# Patient Record
Sex: Male | Born: 1945 | Race: White | Hispanic: No | Marital: Single | Smoking: Former smoker
Health system: Southern US, Community
[De-identification: ages and names within clinical notes are randomized; demographics above are authoritative.]

## PROBLEM LIST (undated history)

## (undated) DIAGNOSIS — I10 Essential (primary) hypertension: Secondary | ICD-10-CM

## (undated) DIAGNOSIS — M1990 Unspecified osteoarthritis, unspecified site: Secondary | ICD-10-CM

## (undated) DIAGNOSIS — H04129 Dry eye syndrome of unspecified lacrimal gland: Secondary | ICD-10-CM

## (undated) DIAGNOSIS — N529 Male erectile dysfunction, unspecified: Secondary | ICD-10-CM

## (undated) DIAGNOSIS — K759 Inflammatory liver disease, unspecified: Secondary | ICD-10-CM

## (undated) DIAGNOSIS — K219 Gastro-esophageal reflux disease without esophagitis: Secondary | ICD-10-CM

## (undated) DIAGNOSIS — L409 Psoriasis, unspecified: Secondary | ICD-10-CM

## (undated) DIAGNOSIS — K5790 Diverticulosis of intestine, part unspecified, without perforation or abscess without bleeding: Secondary | ICD-10-CM

## (undated) DIAGNOSIS — R3915 Urgency of urination: Secondary | ICD-10-CM

## (undated) DIAGNOSIS — R002 Palpitations: Secondary | ICD-10-CM

## (undated) DIAGNOSIS — L719 Rosacea, unspecified: Secondary | ICD-10-CM

## (undated) DIAGNOSIS — G4761 Periodic limb movement disorder: Secondary | ICD-10-CM

## (undated) DIAGNOSIS — F329 Major depressive disorder, single episode, unspecified: Secondary | ICD-10-CM

## (undated) DIAGNOSIS — F1020 Alcohol dependence, uncomplicated: Secondary | ICD-10-CM

## (undated) DIAGNOSIS — H3530 Unspecified macular degeneration: Secondary | ICD-10-CM

## (undated) DIAGNOSIS — E119 Type 2 diabetes mellitus without complications: Secondary | ICD-10-CM

## (undated) DIAGNOSIS — R351 Nocturia: Secondary | ICD-10-CM

## (undated) DIAGNOSIS — E785 Hyperlipidemia, unspecified: Secondary | ICD-10-CM

## (undated) DIAGNOSIS — T4145XA Adverse effect of unspecified anesthetic, initial encounter: Secondary | ICD-10-CM

## (undated) DIAGNOSIS — G5620 Lesion of ulnar nerve, unspecified upper limb: Secondary | ICD-10-CM

## (undated) DIAGNOSIS — R945 Abnormal results of liver function studies: Secondary | ICD-10-CM

## (undated) DIAGNOSIS — T8859XA Other complications of anesthesia, initial encounter: Secondary | ICD-10-CM

## (undated) DIAGNOSIS — R7989 Other specified abnormal findings of blood chemistry: Secondary | ICD-10-CM

## (undated) DIAGNOSIS — M5412 Radiculopathy, cervical region: Secondary | ICD-10-CM

## (undated) DIAGNOSIS — M542 Cervicalgia: Secondary | ICD-10-CM

## (undated) DIAGNOSIS — F32A Depression, unspecified: Secondary | ICD-10-CM

## (undated) DIAGNOSIS — C44301 Unspecified malignant neoplasm of skin of nose: Secondary | ICD-10-CM

## (undated) DIAGNOSIS — F419 Anxiety disorder, unspecified: Secondary | ICD-10-CM

## (undated) DIAGNOSIS — R3129 Other microscopic hematuria: Secondary | ICD-10-CM

## (undated) DIAGNOSIS — G4730 Sleep apnea, unspecified: Secondary | ICD-10-CM

## (undated) DIAGNOSIS — M4802 Spinal stenosis, cervical region: Secondary | ICD-10-CM

## (undated) DIAGNOSIS — M722 Plantar fascial fibromatosis: Secondary | ICD-10-CM

## (undated) HISTORY — DX: Depression, unspecified: F32.A

## (undated) HISTORY — DX: Spinal stenosis, cervical region: M48.02

## (undated) HISTORY — DX: Abnormal results of liver function studies: R94.5

## (undated) HISTORY — DX: Other specified abnormal findings of blood chemistry: R79.89

## (undated) HISTORY — DX: Other microscopic hematuria: R31.29

## (undated) HISTORY — DX: Radiculopathy, cervical region: M54.12

## (undated) HISTORY — DX: Male erectile dysfunction, unspecified: N52.9

## (undated) HISTORY — DX: Psoriasis, unspecified: L40.9

## (undated) HISTORY — DX: Urgency of urination: R39.15

## (undated) HISTORY — DX: Plantar fascial fibromatosis: M72.2

## (undated) HISTORY — DX: Dry eye syndrome of unspecified lacrimal gland: H04.129

## (undated) HISTORY — DX: Alcohol dependence, uncomplicated: F10.20

## (undated) HISTORY — PX: UMBILICAL HERNIA REPAIR: SHX2598

## (undated) HISTORY — DX: Lesion of ulnar nerve, unspecified upper limb: G56.20

## (undated) HISTORY — PX: CERVICAL SPINE SURGERY: SHX589

## (undated) HISTORY — DX: Palpitations: R00.2

## (undated) HISTORY — DX: Diverticulosis of intestine, part unspecified, without perforation or abscess without bleeding: K57.90

## (undated) HISTORY — DX: Hyperlipidemia, unspecified: E78.5

## (undated) HISTORY — DX: Nocturia: R35.1

## (undated) HISTORY — DX: Gastro-esophageal reflux disease without esophagitis: K21.9

## (undated) HISTORY — DX: Cervicalgia: M54.2

## (undated) HISTORY — DX: Unspecified macular degeneration: H35.30

## (undated) HISTORY — DX: Periodic limb movement disorder: G47.61

## (undated) HISTORY — DX: Type 2 diabetes mellitus without complications: E11.9

## (undated) HISTORY — DX: Unspecified malignant neoplasm of skin of nose: C44.301

## (undated) HISTORY — DX: Major depressive disorder, single episode, unspecified: F32.9

## (undated) HISTORY — DX: Rosacea, unspecified: L71.9

## (undated) HISTORY — DX: Unspecified osteoarthritis, unspecified site: M19.90

---

## 2003-09-15 DIAGNOSIS — G5620 Lesion of ulnar nerve, unspecified upper limb: Secondary | ICD-10-CM

## 2003-09-15 HISTORY — DX: Lesion of ulnar nerve, unspecified upper limb: G56.20

## 2005-01-23 ENCOUNTER — Encounter: Admission: RE | Admit: 2005-01-23 | Discharge: 2005-01-23 | Payer: Self-pay | Admitting: Urology

## 2005-01-25 ENCOUNTER — Ambulatory Visit (HOSPITAL_BASED_OUTPATIENT_CLINIC_OR_DEPARTMENT_OTHER): Admission: RE | Admit: 2005-01-25 | Discharge: 2005-01-25 | Payer: Self-pay | Admitting: Urology

## 2005-01-25 ENCOUNTER — Ambulatory Visit (HOSPITAL_COMMUNITY): Admission: RE | Admit: 2005-01-25 | Discharge: 2005-01-25 | Payer: Self-pay | Admitting: Urology

## 2007-11-11 ENCOUNTER — Encounter: Admission: RE | Admit: 2007-11-11 | Discharge: 2007-11-11 | Payer: Self-pay | Admitting: Otolaryngology

## 2008-03-12 ENCOUNTER — Encounter: Admission: RE | Admit: 2008-03-12 | Discharge: 2008-03-12 | Payer: Self-pay | Admitting: Family Medicine

## 2008-06-24 ENCOUNTER — Encounter: Admission: RE | Admit: 2008-06-24 | Discharge: 2008-06-24 | Payer: Self-pay | Admitting: Family Medicine

## 2008-07-27 ENCOUNTER — Emergency Department (HOSPITAL_COMMUNITY): Admission: EM | Admit: 2008-07-27 | Discharge: 2008-07-27 | Payer: Self-pay | Admitting: Emergency Medicine

## 2008-09-04 ENCOUNTER — Encounter: Admission: RE | Admit: 2008-09-04 | Discharge: 2008-09-04 | Payer: Self-pay | Admitting: Psychiatry

## 2008-09-28 ENCOUNTER — Other Ambulatory Visit (HOSPITAL_COMMUNITY): Admission: RE | Admit: 2008-09-28 | Discharge: 2008-12-08 | Payer: Self-pay | Admitting: Psychiatry

## 2008-10-08 ENCOUNTER — Ambulatory Visit: Payer: Self-pay | Admitting: Psychiatry

## 2008-10-13 ENCOUNTER — Encounter: Admission: RE | Admit: 2008-10-13 | Discharge: 2008-12-29 | Payer: Self-pay | Admitting: Family Medicine

## 2008-12-07 ENCOUNTER — Encounter: Admission: RE | Admit: 2008-12-07 | Discharge: 2008-12-07 | Payer: Self-pay | Admitting: Neurological Surgery

## 2009-07-23 ENCOUNTER — Ambulatory Visit (HOSPITAL_COMMUNITY): Admission: RE | Admit: 2009-07-23 | Discharge: 2009-07-24 | Payer: Self-pay | Admitting: Neurological Surgery

## 2009-08-17 ENCOUNTER — Encounter: Admission: RE | Admit: 2009-08-17 | Discharge: 2009-08-17 | Payer: Self-pay | Admitting: Neurological Surgery

## 2009-10-11 ENCOUNTER — Encounter: Admission: RE | Admit: 2009-10-11 | Discharge: 2009-10-11 | Payer: Self-pay | Admitting: Neurological Surgery

## 2010-08-22 ENCOUNTER — Encounter
Admission: RE | Admit: 2010-08-22 | Discharge: 2010-08-22 | Payer: Self-pay | Source: Home / Self Care | Attending: Neurological Surgery | Admitting: Neurological Surgery

## 2010-09-04 ENCOUNTER — Encounter: Payer: Self-pay | Admitting: Otolaryngology

## 2010-09-12 ENCOUNTER — Encounter
Admission: RE | Admit: 2010-09-12 | Discharge: 2010-09-12 | Payer: Self-pay | Source: Home / Self Care | Attending: Neurological Surgery | Admitting: Neurological Surgery

## 2010-11-15 LAB — BASIC METABOLIC PANEL
CO2: 27 mEq/L (ref 19–32)
Chloride: 104 mEq/L (ref 96–112)
Creatinine, Ser: 0.7 mg/dL (ref 0.4–1.5)
GFR calc Af Amer: >60 mL/min (ref >60)
Potassium: 3.5 mEq/L (ref 3.5–5.1)
Sodium: 138 mEq/L (ref 135–145)

## 2010-11-15 LAB — DIFFERENTIAL
Eosinophils Relative: 3 % (ref 0–5)
Lymphocytes Relative: 36 % (ref 12–46)
Monocytes Absolute: 0.6 10*3/uL (ref 0.1–1.0)
Neutro Abs: 4 10*3/uL (ref 1.7–7.7)

## 2010-11-15 LAB — CBC
MCV: 94.4 fL (ref 78.0–100.0)
WBC: 7.6 10*3/uL (ref 4.0–10.5)

## 2010-11-15 LAB — APTT: aPTT: 32 seconds (ref 24–37)

## 2010-11-15 LAB — PROTIME-INR: Prothrombin Time: 12.7 seconds (ref 11.6–15.2)

## 2010-11-23 LAB — URINE DRUGS OF ABUSE SCREEN W ALC, ROUTINE (REF LAB)
Barbiturate Quant, Ur: NEGATIVE
Benzodiazepines.: NEGATIVE
Creatinine,U: 127.2 mg/dL
Phencyclidine (PCP): NEGATIVE
Propoxyphene: NEGATIVE

## 2010-11-24 LAB — URINE DRUGS OF ABUSE SCREEN W ALC, ROUTINE (REF LAB)
Amphetamine Screen, Ur: NEGATIVE
Amphetamine Screen, Ur: NEGATIVE
Benzodiazepines.: NEGATIVE
Cocaine Metabolites: NEGATIVE
Creatinine,U: 178.6 mg/dL
Creatinine,U: 147.2 mg/dL
Ethyl Alcohol: <10 mg/dL (ref <10)
Marijuana Metabolite: NEGATIVE
Methadone: NEGATIVE
Methadone: NEGATIVE
Phencyclidine (PCP): NEGATIVE
Phencyclidine (PCP): NEGATIVE
Propoxyphene: NEGATIVE

## 2010-12-30 NOTE — Op Note (Signed)
NAME:  Shannon Stone, Shannon Stone NO.:  [id number]   MEDICAL RECORD NO.:  [id number]          PATIENT TYPE:  AMB   LOCATION:  [facility]                         FACILITY:  [facility]   PHYSICIAN:  Rozanna Boer., M.D.DATE OF BIRTH:  August 23, 1945   DATE OF PROCEDURE:  01/25/2005  DATE OF DISCHARGE:                                 OPERATIVE REPORT   PREOPERATIVE DIAGNOSIS:  Phimosis.   POSTOPERATIVE DIAGNOSIS:  Phimosis.   OPERATION:  Circumcision.   ANESTHESIA:  General.   SURGEON:  Courtney Paris, M.D.   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  His wife has since left him and he is not sexually active at  the moment, but has a phimosis and comes in for elective circumcision at  this time.  He says he is borderline diabetic.   The patient was placed supine on the operating table after satisfactory  induction of general anesthesia.  He was prepped and draped with Betadine in  the usual sterile fashion.  The redundant skin was marked off using a  marking pen, and a circumferential incision made.  The skin was then  retracted and another circumferential incision made around the coronal  sulcus.  The skin was then removed with the Bovie with a fine needle  electrode to effect good hemostasis.  The edges of the skin were then  reapproximated with interrupted 4-0 chromic catgut suture to effect a good  cosmetic result.  8 mL of 0.25% plain Marcaine was injected in the base of  the penis for postoperative pain relief, and he was taken to the recovery  room in good condition where he will later be discharged as an outpatient.  Will be sent home with detailed written instructions.       HMK/MEDQ  D:  01/25/2005  T:  01/25/2005  Job:  [id number]

## 2011-06-05 ENCOUNTER — Other Ambulatory Visit: Payer: Self-pay | Admitting: Orthopedic Surgery

## 2011-06-05 DIAGNOSIS — R609 Edema, unspecified: Secondary | ICD-10-CM

## 2011-06-05 DIAGNOSIS — R52 Pain, unspecified: Secondary | ICD-10-CM

## 2011-06-11 ENCOUNTER — Ambulatory Visit
Admission: RE | Admit: 2011-06-11 | Discharge: 2011-06-11 | Disposition: A | Discharge status: Home / Self Care | Payer: BC Managed Care – PPO | Source: Ambulatory Visit | Attending: Orthopedic Surgery | Admitting: Orthopedic Surgery

## 2011-06-11 DIAGNOSIS — R609 Edema, unspecified: Secondary | ICD-10-CM

## 2011-06-11 DIAGNOSIS — R52 Pain, unspecified: Secondary | ICD-10-CM

## 2011-08-15 HISTORY — PX: KNEE SURGERY: SHX244

## 2011-11-20 ENCOUNTER — Ambulatory Visit (HOSPITAL_COMMUNITY)
Admission: RE | Admit: 2011-11-20 | Discharge: 2011-11-20 | Disposition: A | Discharge status: Home / Self Care | Payer: BC Managed Care – PPO | Source: Ambulatory Visit | Attending: Neurological Surgery | Admitting: Neurological Surgery

## 2011-11-20 ENCOUNTER — Other Ambulatory Visit (HOSPITAL_COMMUNITY): Payer: Self-pay | Admitting: Neurological Surgery

## 2011-11-20 DIAGNOSIS — M5412 Radiculopathy, cervical region: Secondary | ICD-10-CM

## 2011-11-20 DIAGNOSIS — M542 Cervicalgia: Secondary | ICD-10-CM

## 2011-11-20 DIAGNOSIS — M47812 Spondylosis without myelopathy or radiculopathy, cervical region: Secondary | ICD-10-CM | POA: Insufficient documentation

## 2011-11-20 DIAGNOSIS — Z981 Arthrodesis status: Secondary | ICD-10-CM | POA: Insufficient documentation

## 2011-11-20 DIAGNOSIS — M79609 Pain in unspecified limb: Secondary | ICD-10-CM | POA: Insufficient documentation

## 2011-11-21 ENCOUNTER — Encounter (HOSPITAL_COMMUNITY): Payer: Self-pay | Admitting: Pharmacy Technician

## 2011-11-25 NOTE — Pre-Procedure Instructions (Signed)
20 Maison Kestenbaum  11/25/2011   Your procedure is scheduled on:  Fri, April 19 @ 1200 PM  Report to [facility] at 1000 AM.  Call this number if you have problems the morning of surgery: [phone]   Remember:   Do not eat food:After Midnight.  May have clear liquids: up to 4 Hours before arrival.(until 6:00 am)  Clear liquids include soda, tea, black coffee, apple or grape juice, broth,water  Take these medicines the morning of surgery with A SIP OF WATER: Amlodipine,Atenolol,and Paxil  Do not wear jewelry  Do not wear lotions, powders, or perfumes.   Do not bring valuables to the hospital.  Contacts, dentures or bridgework may not be worn into surgery.  Leave suitcase in the car. After surgery it may be brought to your room.  For patients admitted to the hospital, checkout time is 11:00 AM the day of discharge.   Special Instructions: CHG Shower Use Special Wash: 1/2 bottle night before surgery and 1/2 bottle morning of surgery.   Please read over the following fact sheets that you were given: Pain Booklet, Coughing and Deep Breathing, MRSA Information and Surgical Site Infection Prevention

## 2011-11-27 ENCOUNTER — Encounter (HOSPITAL_COMMUNITY)
Admission: RE | Admit: 2011-11-27 | Discharge: 2011-11-27 | Disposition: A | Discharge status: Home / Self Care | Payer: BC Managed Care – PPO | Source: Ambulatory Visit | Attending: Neurological Surgery | Admitting: Neurological Surgery

## 2011-11-27 ENCOUNTER — Encounter (HOSPITAL_COMMUNITY): Payer: Self-pay

## 2011-11-27 ENCOUNTER — Encounter (HOSPITAL_COMMUNITY)
Admission: RE | Admit: 2011-11-27 | Discharge: 2011-11-27 | Disposition: A | Discharge status: Home / Self Care | Payer: BC Managed Care – PPO | Source: Ambulatory Visit | Attending: Anesthesiology | Admitting: Anesthesiology

## 2011-11-27 HISTORY — DX: Adverse effect of unspecified anesthetic, initial encounter: T41.45XA

## 2011-11-27 HISTORY — DX: Other complications of anesthesia, initial encounter: T88.59XA

## 2011-11-27 HISTORY — DX: Sleep apnea, unspecified: G47.30

## 2011-11-27 HISTORY — DX: Essential (primary) hypertension: I10

## 2011-11-27 HISTORY — DX: Inflammatory liver disease, unspecified: K75.9

## 2011-11-27 HISTORY — DX: Anxiety disorder, unspecified: F41.9

## 2011-11-27 LAB — CBC
Hemoglobin: 14.9 g/dL (ref 13.0–17.0)
MCH: 31.3 pg (ref 26.0–34.0)
MCHC: 36.3 g/dL — ABNORMAL HIGH (ref 30.0–36.0)
RDW: 13.7 % (ref 11.5–15.5)

## 2011-11-27 LAB — COMPREHENSIVE METABOLIC PANEL
Albumin: 3.5 g/dL (ref 3.5–5.2)
BUN: 10 mg/dL (ref 6–23)
CO2: 29 mEq/L (ref 19–32)
Calcium: 9 mg/dL (ref 8.4–10.5)
Chloride: 98 mEq/L (ref 96–112)
Creatinine, Ser: 0.56 mg/dL (ref 0.50–1.35)
GFR calc non Af Amer: >90 mL/min (ref >90)
Total Bilirubin: 0.4 mg/dL (ref 0.3–1.2)

## 2011-11-27 LAB — APTT: aPTT: 30 seconds (ref 24–37)

## 2011-11-27 LAB — PROTIME-INR
INR: 0.93 (ref 0.00–1.49)
Prothrombin Time: 12.7 seconds (ref 11.6–15.2)

## 2011-11-27 LAB — SURGICAL PCR SCREEN: MRSA, PCR: NEGATIVE

## 2011-11-27 NOTE — Progress Notes (Signed)
REREQUESTED ORDERS FROM VANESSA AT DR [facility] OFFICE.

## 2011-11-27 NOTE — Progress Notes (Signed)
LEFT VOICEMAIL FOR DR FULPS NURSE TO SEE IF THEY HAVE COPY OF STRESS TEST OR KNOW WHERE PATIENT HAD DONE.   [phone]

## 2011-11-27 NOTE — Progress Notes (Signed)
REQUESTED STRESS TEST, OV CARDIAC STUDIES AND SLEEP STUDY FROM [facility] .

## 2011-11-27 NOTE — Pre-Procedure Instructions (Signed)
20 Shannon Stone  11/27/2011   Your procedure is scheduled on:   Friday 12/01/11  Report to [facility] at 1000 AM.  Call this number if you have problems the morning of surgery: [phone]   Remember:   Do not eat food:After Midnight.  May have clear liquids: up to 4 Hours before arrival.  Clear liquids include soda, tea, black coffee, apple or grape juice, broth.  Take these medicines the morning of surgery with A SIP OF WATER:  AMLODIPINE (NORVASC), ATENOLOL(TENORMIN), PAXIL   Do not wear jewelry, make-up or nail polish.  Do not wear lotions, powders, or perfumes. You may wear deodorant.  Do not shave 48 hours prior to surgery.  Do not bring valuables to the hospital.  Contacts, dentures or bridgework may not be worn into surgery.  Leave suitcase in the car. After surgery it may be brought to your room.  For patients admitted to the hospital, checkout time is 11:00 AM the day of discharge.   Patients discharged the day of surgery will not be allowed to drive home.  Name and phone number of your driver:   Special Instructions: CHG Shower Use Special Wash: 1/2 bottle night before surgery and 1/2 bottle morning of surgery.   Please read over the following fact sheets that you were given: Pain Booklet, MRSA Information and Surgical Site Infection Prevention

## 2011-11-29 NOTE — Consult Note (Addendum)
Anesthesia:  Patient is a 66 year old male posted for a C5-6 posterior cervical fusion on 12/01/11.  History includes HTN, OSA, obesity, anxiety, post-operative urinary retention, skin cancer, Hepatitis C (? Cleared by history).  Prior c-spine (2010), knee, and UHR surgeries.  Labs noted.  Glucose is elevated at 353.  He did not report any history of DM.  LFTS, PLTs, coags WNL.  CXR on 11/27/11 showed chronic bronchitic changes with slightly increased atelectasis versus scarring at left base.   EKG on 11/27/11 showed SB @ 56 bpm.  He had a prior stress test which his PAT nurse is trying to locate.  His PCP is Dr. Jillyn Hidden [phone]).  I have left a message for Mr. Coger to contact me so I can discuss urgent follow-up with his PCP for evaluation of his hyperglycemia pre-operatively.  I have updated Erie Noe at Dr. Yetta Barre' office. She will also attempt to contact him.  Addendum: 11/30/11 1330  Mr.Treadwell called me earlier this morning.  He denied history of DM, but had recently been told that his glucose was elevated by Dr. Jillyn Hidden.  He has not had any recent steroids that would effect his glucose.  I just received a call back from Dr.Fulp.  She reports that his glucose (fasting, she believes) was 150 on 11/20/11.  He was asked to come back in for a A1C which has not been done yet.  Due to the fact that his surgery is scheduled for tomorrow, she would recommend that either he be admitted today for pre-operative glucose control/management or surgery be postponed until he is started on a hyperglycemic regimen.  If his surgery is postponed, Dr. Jillyn Hidden would like him seen in their office by 12/04/11.  Anesthesiologist Dr. Chaney Malling felt this was a reasonable plan.  I updated Erie Noe at Dr. Edward Qualia office.  I've asked Erie Noe to notify patient to follow-up at Dr. Debroah Baller office by 12/04/11 in his procedure is cancelled.  In regards to Mr. Warsame's treadmill stress test.  He thought it was a few years ago.  He could not tell me who  ordered it, where it was done, the exact date, or even why it was ordered.  He thinks it was routine.  He says it was "okay." He denies CP.  He was previously seen by Dr. Joselyn Arrow at the [facility] location, and their last stress test on file was dated from 2002.  [facility] does not have a stress test either.  Dr. Debroah Baller office did not have any old EKGs, but was asked to fax a copy of a recent stress test if they have one available.  Patient has only been seen there for about a year though.

## 2011-11-30 MED ORDER — CEFAZOLIN SODIUM-DEXTROSE 2-3 GM-% IV SOLR
2.0000 g | INTRAVENOUS | Status: AC
  Administered 2011-12-01: 2 g via INTRAVENOUS
  Filled 2011-11-30: qty 50

## 2011-11-30 MED ORDER — DEXAMETHASONE SODIUM PHOSPHATE 10 MG/ML IJ SOLN
10.0000 mg | Freq: Once | INTRAMUSCULAR | Status: DC
  Filled 2011-11-30: qty 1

## 2011-11-30 NOTE — Progress Notes (Signed)
[facility] office [phone] states there is only a stress test from 2002 in their records.   Left message for Dr. Yetta Barre nurse or Erie Noe about CBC done at PAT but CBC with Diff on paper orders that came after PAT done.  Do we need to do Diff DOS or is it okay to omit??

## 2011-12-01 ENCOUNTER — Ambulatory Visit (HOSPITAL_COMMUNITY)
Admission: RE | Admit: 2011-12-01 | Discharge: 2011-12-02 | Disposition: A | Discharge status: Home / Self Care | Payer: BC Managed Care – PPO | Source: Ambulatory Visit | Attending: Neurological Surgery | Admitting: Neurological Surgery

## 2011-12-01 ENCOUNTER — Encounter (HOSPITAL_COMMUNITY)
Admission: RE | Disposition: A | Discharge status: Home / Self Care | Payer: Self-pay | Source: Ambulatory Visit | Attending: Neurological Surgery

## 2011-12-01 ENCOUNTER — Encounter (HOSPITAL_COMMUNITY): Payer: Self-pay | Admitting: Vascular Surgery

## 2011-12-01 ENCOUNTER — Ambulatory Visit (HOSPITAL_COMMUNITY): Payer: BC Managed Care – PPO | Admitting: Vascular Surgery

## 2011-12-01 ENCOUNTER — Encounter (HOSPITAL_COMMUNITY): Payer: Self-pay | Admitting: *Deleted

## 2011-12-01 ENCOUNTER — Ambulatory Visit (HOSPITAL_COMMUNITY): Payer: BC Managed Care – PPO

## 2011-12-01 DIAGNOSIS — T84498A Other mechanical complication of other internal orthopedic devices, implants and grafts, initial encounter: Secondary | ICD-10-CM | POA: Insufficient documentation

## 2011-12-01 DIAGNOSIS — I10 Essential (primary) hypertension: Secondary | ICD-10-CM | POA: Insufficient documentation

## 2011-12-01 DIAGNOSIS — Z01812 Encounter for preprocedural laboratory examination: Secondary | ICD-10-CM | POA: Insufficient documentation

## 2011-12-01 DIAGNOSIS — Z981 Arthrodesis status: Secondary | ICD-10-CM | POA: Insufficient documentation

## 2011-12-01 DIAGNOSIS — Y832 Surgical operation with anastomosis, bypass or graft as the cause of abnormal reaction of the patient, or of later complication, without mention of misadventure at the time of the procedure: Secondary | ICD-10-CM | POA: Insufficient documentation

## 2011-12-01 HISTORY — PX: POSTERIOR CERVICAL FUSION/FORAMINOTOMY: SHX5038

## 2011-12-01 LAB — GLUCOSE, CAPILLARY: Glucose-Capillary: 133 mg/dL — ABNORMAL HIGH (ref 70–99)

## 2011-12-01 SURGERY — POSTERIOR CERVICAL FUSION/FORAMINOTOMY LEVEL 1
Anesthesia: General | Site: Neck | Laterality: Bilateral | Wound class: Clean

## 2011-12-01 MED ORDER — BUPIVACAINE HCL (PF) 0.25 % IJ SOLN
INTRAMUSCULAR | Status: DC | PRN
  Administered 2011-12-01: 8 mL
  Administered 2011-12-01: 14 mL

## 2011-12-01 MED ORDER — AMLODIPINE BESYLATE 10 MG PO TABS
10.0000 mg | ORAL_TABLET | Freq: Every day | ORAL | Status: DC
  Administered 2011-12-01: 10 mg via ORAL
  Filled 2011-12-01 (×3): qty 1

## 2011-12-01 MED ORDER — SODIUM CHLORIDE 0.9 % IV SOLN
INTRAVENOUS | Status: AC
  Filled 2011-12-01: qty 500

## 2011-12-01 MED ORDER — POTASSIUM CHLORIDE IN NACL 20-0.9 MEQ/L-% IV SOLN
INTRAVENOUS | Status: DC
  Administered 2011-12-01: 22:00:00 via INTRAVENOUS
  Filled 2011-12-01 (×3): qty 1000

## 2011-12-01 MED ORDER — LIDOCAINE HCL (CARDIAC) 20 MG/ML IV SOLN
INTRAVENOUS | Status: DC | PRN
  Administered 2011-12-01: 60 mg via INTRAVENOUS

## 2011-12-01 MED ORDER — ZOLPIDEM TARTRATE 5 MG PO TABS: 10.0000 mg | ORAL_TABLET | Freq: Every evening | ORAL | Status: DC | PRN

## 2011-12-01 MED ORDER — SODIUM CHLORIDE 0.9 % IJ SOLN: 3.0000 mL | INTRAMUSCULAR | Status: DC | PRN

## 2011-12-01 MED ORDER — SODIUM CHLORIDE 0.9 % IR SOLN
Status: DC | PRN
  Administered 2011-12-01: 15:00:00

## 2011-12-01 MED ORDER — FENTANYL CITRATE 0.05 MG/ML IJ SOLN
INTRAMUSCULAR | Status: DC | PRN
  Administered 2011-12-01: 150 ug via INTRAVENOUS
  Administered 2011-12-01 (×2): 50 ug via INTRAVENOUS

## 2011-12-01 MED ORDER — DEXAMETHASONE SODIUM PHOSPHATE 4 MG/ML IJ SOLN
4.0000 mg | Freq: Four times a day (QID) | INTRAMUSCULAR | Status: DC
  Administered 2011-12-02: 4 mg via INTRAVENOUS
  Filled 2011-12-01 (×5): qty 1

## 2011-12-01 MED ORDER — MIDAZOLAM HCL 5 MG/5ML IJ SOLN
INTRAMUSCULAR | Status: DC | PRN
  Administered 2011-12-01: 2 mg via INTRAVENOUS

## 2011-12-01 MED ORDER — HYDROMORPHONE HCL PF 1 MG/ML IJ SOLN
0.2500 mg | INTRAMUSCULAR | Status: DC | PRN
  Administered 2011-12-01 (×2): 0.5 mg via INTRAVENOUS

## 2011-12-01 MED ORDER — DEXTROSE 5 % IV SOLN
500.0000 mg | Freq: Four times a day (QID) | INTRAVENOUS | Status: DC | PRN
  Filled 2011-12-01: qty 5

## 2011-12-01 MED ORDER — LACTATED RINGERS IV SOLN
INTRAVENOUS | Status: DC | PRN
  Administered 2011-12-01 (×2): via INTRAVENOUS

## 2011-12-01 MED ORDER — CEFAZOLIN SODIUM 1-5 GM-% IV SOLN
1.0000 g | Freq: Three times a day (TID) | INTRAVENOUS | Status: AC
  Administered 2011-12-01 – 2011-12-02 (×2): 1 g via INTRAVENOUS
  Filled 2011-12-01 (×2): qty 50

## 2011-12-01 MED ORDER — MORPHINE SULFATE 2 MG/ML IJ SOLN: 0.0500 mg/kg | INTRAMUSCULAR | Status: DC | PRN

## 2011-12-01 MED ORDER — DEXAMETHASONE 4 MG PO TABS
4.0000 mg | ORAL_TABLET | Freq: Four times a day (QID) | ORAL | Status: DC
  Administered 2011-12-01 (×2): 4 mg via ORAL
  Filled 2011-12-01 (×6): qty 1

## 2011-12-01 MED ORDER — HYDROMORPHONE HCL PF 1 MG/ML IJ SOLN: 0.5000 mg | INTRAMUSCULAR | Status: DC | PRN

## 2011-12-01 MED ORDER — ROCURONIUM BROMIDE 100 MG/10ML IV SOLN
INTRAVENOUS | Status: DC | PRN
  Administered 2011-12-01: 50 mg via INTRAVENOUS

## 2011-12-01 MED ORDER — METHOCARBAMOL 500 MG PO TABS
500.0000 mg | ORAL_TABLET | Freq: Four times a day (QID) | ORAL | Status: DC | PRN
  Administered 2011-12-01: 500 mg via ORAL
  Filled 2011-12-01: qty 1

## 2011-12-01 MED ORDER — OXYCODONE-ACETAMINOPHEN 5-325 MG PO TABS
1.0000 | ORAL_TABLET | ORAL | Status: DC | PRN
  Administered 2011-12-01 – 2011-12-02 (×2): 2 via ORAL
  Filled 2011-12-01 (×2): qty 2

## 2011-12-01 MED ORDER — SODIUM CHLORIDE 0.9 % IJ SOLN
3.0000 mL | Freq: Two times a day (BID) | INTRAMUSCULAR | Status: DC
  Administered 2011-12-01: 3 mL via INTRAVENOUS

## 2011-12-01 MED ORDER — SENNA 8.6 MG PO TABS
1.0000 | ORAL_TABLET | Freq: Two times a day (BID) | ORAL | Status: DC
  Administered 2011-12-01: 8.6 mg via ORAL
  Filled 2011-12-01 (×3): qty 1

## 2011-12-01 MED ORDER — ONDANSETRON HCL 4 MG/2ML IJ SOLN
INTRAMUSCULAR | Status: DC | PRN
  Administered 2011-12-01: 4 mg via INTRAVENOUS

## 2011-12-01 MED ORDER — ACETAMINOPHEN 650 MG RE SUPP: 650.0000 mg | RECTAL | Status: DC | PRN

## 2011-12-01 MED ORDER — CHLORTHALIDONE 25 MG PO TABS
25.0000 mg | ORAL_TABLET | Freq: Every day | ORAL | Status: DC
  Administered 2011-12-01: 25 mg via ORAL
  Filled 2011-12-01 (×3): qty 1

## 2011-12-01 MED ORDER — THROMBIN 5000 UNITS EX KIT
PACK | CUTANEOUS | Status: DC | PRN
  Administered 2011-12-01 (×2): 5000 [IU] via TOPICAL

## 2011-12-01 MED ORDER — NEOSTIGMINE METHYLSULFATE 1 MG/ML IJ SOLN
INTRAMUSCULAR | Status: DC | PRN
  Administered 2011-12-01: 5 mg via INTRAVENOUS

## 2011-12-01 MED ORDER — ACETAMINOPHEN 325 MG PO TABS: 650.0000 mg | ORAL_TABLET | ORAL | Status: DC | PRN

## 2011-12-01 MED ORDER — HYDROMORPHONE HCL PF 1 MG/ML IJ SOLN
INTRAMUSCULAR | Status: AC
  Filled 2011-12-01: qty 1

## 2011-12-01 MED ORDER — ONDANSETRON HCL 4 MG/2ML IJ SOLN: 4.0000 mg | INTRAMUSCULAR | Status: DC | PRN

## 2011-12-01 MED ORDER — MENTHOL 3 MG MT LOZG: 1.0000 | LOZENGE | OROMUCOSAL | Status: DC | PRN

## 2011-12-01 MED ORDER — PAROXETINE HCL 20 MG PO TABS
40.0000 mg | ORAL_TABLET | Freq: Every day | ORAL | Status: DC
  Administered 2011-12-01: 40 mg via ORAL
  Filled 2011-12-01 (×3): qty 2

## 2011-12-01 MED ORDER — PROPOFOL 10 MG/ML IV EMUL
INTRAVENOUS | Status: DC | PRN
  Administered 2011-12-01: 200 mg via INTRAVENOUS

## 2011-12-01 MED ORDER — SODIUM CHLORIDE 0.9 % IV SOLN: 250.0000 mL | INTRAVENOUS | Status: DC

## 2011-12-01 MED ORDER — ATENOLOL 50 MG PO TABS
50.0000 mg | ORAL_TABLET | Freq: Every day | ORAL | Status: DC
  Administered 2011-12-01: 50 mg via ORAL
  Filled 2011-12-01 (×3): qty 1

## 2011-12-01 MED ORDER — PHENOL 1.4 % MT LIQD: 1.0000 | OROMUCOSAL | Status: DC | PRN

## 2011-12-01 MED ORDER — BACITRACIN 50000 UNITS IM SOLR
INTRAMUSCULAR | Status: AC
  Filled 2011-12-01: qty 1

## 2011-12-01 MED ORDER — GLYCOPYRROLATE 0.2 MG/ML IJ SOLN
INTRAMUSCULAR | Status: DC | PRN
  Administered 2011-12-01: 0.6 mg via INTRAVENOUS

## 2011-12-01 MED ORDER — 0.9 % SODIUM CHLORIDE (POUR BTL) OPTIME
TOPICAL | Status: DC | PRN
  Administered 2011-12-01: 1000 mL

## 2011-12-01 MED ORDER — ONDANSETRON HCL 4 MG/2ML IJ SOLN: 4.0000 mg | Freq: Once | INTRAMUSCULAR | Status: DC | PRN

## 2011-12-01 MED ORDER — HEMOSTATIC AGENTS (NO CHARGE) OPTIME
TOPICAL | Status: DC | PRN
  Administered 2011-12-01: 1 via TOPICAL

## 2011-12-01 MED ORDER — PAROXETINE HCL 20 MG PO TABS: 40.0000 mg | ORAL_TABLET | ORAL | Status: DC

## 2011-12-01 SURGICAL SUPPLY — 64 items
12 MM FIXED DRILL ×1 IMPLANT
APL SKNCLS STERI-STRIP NONHPOA (GAUZE/BANDAGES/DRESSINGS) ×2
BAG DECANTER FOR FLEXI CONT (MISCELLANEOUS) ×2 IMPLANT
BENZOIN TINCTURE PRP APPL 2/3 (GAUZE/BANDAGES/DRESSINGS) ×3 IMPLANT
BIT DRILL MOUNTAINER FXED 12MM (DRILL) IMPLANT
BLADE SURG ROTATE 9660 (MISCELLANEOUS) ×1 IMPLANT
BUR MATCHSTICK NEURO 3.0 LAGG (BURR) IMPLANT
CANISTER SUCTION 2500CC (MISCELLANEOUS) ×2 IMPLANT
CHLORAPREP W/TINT 26ML (MISCELLANEOUS) ×1 IMPLANT
CLOTH BEACON ORANGE TIMEOUT ST (SAFETY) ×2 IMPLANT
CONT SPEC 4OZ CLIKSEAL STRL BL (MISCELLANEOUS) ×2 IMPLANT
DRAPE C-ARM 42X72 X-RAY (DRAPES) ×4 IMPLANT
DRAPE INCISE 23X17 IOBAN STRL (DRAPES) ×1
DRAPE INCISE 23X17 STRL (DRAPES) IMPLANT
DRAPE INCISE IOBAN 23X17 STRL (DRAPES) ×1 IMPLANT
DRAPE LAPAROTOMY 100X72 PEDS (DRAPES) ×2 IMPLANT
DRAPE POUCH INSTRU U-SHP 10X18 (DRAPES) ×2 IMPLANT
DRESSING TELFA 8X3 (GAUZE/BANDAGES/DRESSINGS) ×2 IMPLANT
DRILL MOUNTAINEER FIXED 12MM (DRILL) ×2
DRSG OPSITE 4X5.5 SM (GAUZE/BANDAGES/DRESSINGS) ×3 IMPLANT
DURAPREP 26ML APPLICATOR (WOUND CARE) ×2 IMPLANT
ELECT REM PT RETURN 9FT ADLT (ELECTROSURGICAL) ×2
ELECTRODE REM PT RTRN 9FT ADLT (ELECTROSURGICAL) ×1 IMPLANT
EVACUATOR 1/8 PVC DRAIN (DRAIN) IMPLANT
GAUZE SPONGE 4X4 16PLY XRAY LF (GAUZE/BANDAGES/DRESSINGS) IMPLANT
GLOVE BIO SURGEON STRL SZ 6.5 (GLOVE) ×2 IMPLANT
GLOVE BIO SURGEON STRL SZ8 (GLOVE) ×2 IMPLANT
GLOVE BIOGEL PI IND STRL 6.5 (GLOVE) IMPLANT
GLOVE BIOGEL PI IND STRL 8.5 (GLOVE) IMPLANT
GLOVE BIOGEL PI INDICATOR 6.5 (GLOVE) ×1
GLOVE BIOGEL PI INDICATOR 8.5 (GLOVE) ×2
GLOVE ECLIPSE 7.5 STRL STRAW (GLOVE) ×1 IMPLANT
GLOVE SURG SS PI 8.0 STRL IVOR (GLOVE) ×2 IMPLANT
GOWN BRE IMP SLV AUR LG STRL (GOWN DISPOSABLE) ×2 IMPLANT
GOWN BRE IMP SLV AUR XL STRL (GOWN DISPOSABLE) ×2 IMPLANT
GOWN STRL REIN 2XL LVL4 (GOWN DISPOSABLE) ×2 IMPLANT
HEMOSTAT POWDER KIT SURGIFOAM (HEMOSTASIS) IMPLANT
KIT BASIN OR (CUSTOM PROCEDURE TRAY) ×2 IMPLANT
KIT ROOM TURNOVER OR (KITS) ×2 IMPLANT
NDL HYPO 25X1 1.5 SAFETY (NEEDLE) ×1 IMPLANT
NDL SPNL 20GX3.5 QUINCKE YW (NEEDLE) IMPLANT
NEEDLE HYPO 25X1 1.5 SAFETY (NEEDLE) ×2 IMPLANT
NEEDLE SPNL 20GX3.5 QUINCKE YW (NEEDLE) IMPLANT
NS IRRIG 1000ML POUR BTL (IV SOLUTION) ×2 IMPLANT
PACK LAMINECTOMY NEURO (CUSTOM PROCEDURE TRAY) ×2 IMPLANT
PAD ARMBOARD 7.5X6 YLW CONV (MISCELLANEOUS) ×4 IMPLANT
PIN MAYFIELD SKULL DISP (PIN) ×2 IMPLANT
PUTTY BONE DBX 5CC MIX (Putty) ×1 IMPLANT
ROD MOUNTAINEER 3.5X60 (Rod) ×1 IMPLANT
SCREW F A 3.5X14 (Screw) ×4 IMPLANT
SCREW INNER (Screw) ×4 IMPLANT
SPONGE GAUZE 4X4 12PLY (GAUZE/BANDAGES/DRESSINGS) ×2 IMPLANT
SPONGE LAP 4X18 X RAY DECT (DISPOSABLE) IMPLANT
SPONGE SURGIFOAM ABS GEL SZ50 (HEMOSTASIS) ×2 IMPLANT
STRIP CLOSURE SKIN 1/2X4 (GAUZE/BANDAGES/DRESSINGS) ×2 IMPLANT
SUT VIC AB 0 CT1 18XCR BRD8 (SUTURE) ×1 IMPLANT
SUT VIC AB 0 CT1 8-18 (SUTURE) ×2
SUT VIC AB 2-0 CP2 18 (SUTURE) ×2 IMPLANT
SUT VIC AB 3-0 SH 8-18 (SUTURE) ×2 IMPLANT
SWABSTICK BENZOIN STERILE (MISCELLANEOUS) ×2 IMPLANT
SYR 20ML ECCENTRIC (SYRINGE) ×2 IMPLANT
TOWEL OR 17X24 6PK STRL BLUE (TOWEL DISPOSABLE) ×2 IMPLANT
TOWEL OR 17X26 10 PK STRL BLUE (TOWEL DISPOSABLE) ×2 IMPLANT
WATER STERILE IRR 1000ML POUR (IV SOLUTION) ×2 IMPLANT

## 2011-12-01 NOTE — Progress Notes (Signed)
Needing to sign consent day of surgery as we did not have orders at PAT.

## 2011-12-01 NOTE — Transfer of Care (Signed)
Immediate Anesthesia Transfer of Care Note  Patient: Shannon Stone  Procedure(s) Performed: Procedure(s) (LRB): POSTERIOR CERVICAL FUSION/FORAMINOTOMY LEVEL 1 (Bilateral)  Patient Location: PACU  Anesthesia Type: General  Level of Consciousness: awake  Airway & Oxygen Therapy: Patient Spontanous Breathing  Post-op Assessment: Report given to PACU RN  Post vital signs: stable  Complications: No apparent anesthesia complications

## 2011-12-01 NOTE — Anesthesia Procedure Notes (Signed)
Procedure Name: Intubation Date/Time: 12/01/2011 2:46 PM Performed by: Hermelinda Dellen A Pre-anesthesia Checklist: Patient identified, Emergency Drugs available, Suction available, Patient being monitored and Timeout performed Patient Re-evaluated:Patient Re-evaluated prior to inductionOxygen Delivery Method: Circle system utilized Preoxygenation: Pre-oxygenation with 100% oxygen Intubation Type: IV induction Ventilation: Mask ventilation without difficulty Laryngoscope Size: Mac and 3 Grade View: Grade I Tube type: Oral Tube size: 7.5 mm Number of attempts: 1 Airway Equipment and Method: Stylet and Video-laryngoscopy Placement Confirmation: ETT inserted through vocal cords under direct vision,  positive ETCO2 and breath sounds checked- equal and bilateral Secured at: 22 cm Tube secured with: Tape Dental Injury: Teeth and Oropharynx as per pre-operative assessment

## 2011-12-01 NOTE — Preoperative (Signed)
Beta Blockers   Reason not to administer Beta Blockers:Not Applicable 

## 2011-12-01 NOTE — Anesthesia Postprocedure Evaluation (Signed)
  Anesthesia Post-op Note  Patient: Shannon Stone  Procedure(s) Performed: Procedure(s) (LRB): POSTERIOR CERVICAL FUSION/FORAMINOTOMY LEVEL 1 (Bilateral)  Patient Location: PACU  Anesthesia Type: General  Level of Consciousness: awake, alert  and oriented  Airway and Oxygen Therapy: Patient Spontanous Breathing and Patient connected to nasal cannula oxygen  Post-op Pain: moderate  Post-op Assessment: Post-op Vital signs reviewed, Patient's Cardiovascular Status Stable, Respiratory Function Stable, Patent Airway, No signs of Nausea or vomiting and Pain level controlled  Post-op Vital Signs: Reviewed  Complications: No apparent anesthesia complications

## 2011-12-01 NOTE — Anesthesia Preprocedure Evaluation (Addendum)
Anesthesia Evaluation  Patient identified by MRN, date of birth, ID band Patient awake    Reviewed: Allergy & Precautions, H&P , NPO status , Patient's Chart, lab work & pertinent test results, reviewed documented beta blocker date and time   Airway Mallampati: I TM Distance: >3 FB Neck ROM: Full    Dental  (+) Teeth Intact and Dental Advisory Given   Pulmonary  breath sounds clear to auscultation        Cardiovascular Rhythm:Regular Rate:Normal     Neuro/Psych    GI/Hepatic   Endo/Other  Morbid obesity  Renal/GU      Musculoskeletal   Abdominal   Peds  Hematology   Anesthesia Other Findings   Reproductive/Obstetrics                           Anesthesia Physical Anesthesia Plan  ASA: III  Anesthesia Plan: General   Post-op Pain Management:    Induction: Intravenous  Airway Management Planned: Oral ETT  Additional Equipment:   Intra-op Plan:   Post-operative Plan: Extubation in OR  Informed Consent: I have reviewed the patients History and Physical, chart, labs and discussed the procedure including the risks, benefits and alternatives for the proposed anesthesia with the patient or authorized representative who has indicated his/her understanding and acceptance.   Dental advisory given  Plan Discussed with: CRNA, Anesthesiologist and Surgeon  Anesthesia Plan Comments:         Anesthesia Quick Evaluation  

## 2011-12-01 NOTE — H&P (Signed)
Subjective:   Patient is a 66 y.o. male admitted for posterior cervical fusion C5-6. The patient first presented to me with complaints of neck pain. Onset of symptoms was several months ago. The pain is described as aching and throbbing and occurs intermittently. The pain is rated severe, and is located at the with radiation to the base of the neck and radiates to the right arm. The symptoms have been progressive. Symptoms are exacerbated by extending head backwards, and are relieved by rest and heat.  Previous work up includes CT of cervical spine, results: Pseudoarthrosis.  Past Medical History  Diagnosis Date  . Complication of anesthesia     DIFFICULTY URINATING  . Hypertension   . Hepatitis     HEP C  +,  THEN -  . 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 Anxiety   . Sleep apnea     [facility] (NO MACHINE )    Past Surgical History  Procedure Date  . Cervical spine surgery     2 YRS AGO   . Knee surgery     LEFT     8 MO AGO   . Umbilical hernia repair     AS CHILD     No Known Allergies  History  Substance Use Topics  . Smoking status: Never Smoker   . Smokeless tobacco: Not on file  . Alcohol Use: No    History reviewed. No pertinent family history. Prior to Admission medications   Medication Sig Start Date End Date Taking? Authorizing Provider  amLODipine (NORVASC) 10 MG tablet Take 10 mg by mouth daily.   Yes Historical Provider, MD  aspirin EC 81 MG tablet Take 81 mg by mouth daily.   Yes Historical Provider, MD  atenolol (TENORMIN) 50 MG tablet Take 50 mg by mouth daily.   Yes Historical Provider, MD  chlorthalidone (HYGROTON) 25 MG tablet Take 25 mg by mouth daily.   Yes Historical Provider, MD  clobetasol cream (TEMOVATE) 0.05 % Apply 1 application topically daily.   Yes Historical Provider, MD  metroNIDAZOLE (METROCREAM) 0.75 % cream Apply 1 application topically at bedtime.   Yes Historical Provider, MD  Omega-3 Fatty Acids (FISH OIL PO) Take 1 capsule by mouth  daily.   Yes Historical Provider, MD  PARoxetine (PAXIL) 40 MG tablet Take 40 mg by mouth every morning.   Yes Historical Provider, MD     Review of Systems  Positive ROS: neg  All other systems have been reviewed and were otherwise negative with the exception of those mentioned in the HPI and as above.  Objective: Vital signs in last 24 hours: Temp:  [97.9 F (36.6 C)] 97.9 F (36.6 C) (04/19 1025) Pulse Rate:  [63] 63  (04/19 1025) Resp:  [18] 18  (04/19 1025) BP: (154)/(78) 154/78 mmHg (04/19 1025) SpO2:  [93 %] 93 % (04/19 1025)  General Appearance: Alert, cooperative, no distress, appears stated age Head: Normocephalic, without obvious abnormality, atraumatic Eyes: PERRL, conjunctiva/corneas clear, EOM's intact, fundi benign, both eyes      Ears: Normal TM's and external ear canals, both ears Throat: Lips, mucosa, and tongue normal; teeth and gums normal Neck: Supple, symmetrical, trachea midline, no adenopathy; thyroid: No enlargement/tenderness/nodules; no carotid bruit or JVD Back: Symmetric, no curvature, ROM normal, no CVA tenderness Lungs: Clear to auscultation bilaterally, respirations unlabored Heart: Regular rate and rhythm, S1 and S2 normal, no murmur, rub or gallop Abdomen: Soft, non-tender, bowel sounds active all  four quadrants, no masses, no organomegaly Extremities: Extremities normal, atraumatic, no cyanosis or edema Pulses: 2+ and symmetric all extremities Skin: Skin color, texture, turgor normal, no rashes or lesions  NEUROLOGIC:  Mental status: Alert and oriented x4, no aphasia, good attention span, fund of knowledge and memory  Motor Exam - grossly normal Sensory Exam - grossly normal Reflexes: 1+ Coordination - grossly normal Gait - grossly normal Balance - grossly normal Cranial Nerves: I: smell Not tested  II: visual acuity  OS: nl    OD: nl  II: visual fields Full to confrontation  II: pupils Equal, round, reactive to light  III,VII: ptosis  None  III,IV,VI: extraocular muscles  Full ROM  V: mastication Normal  V: facial light touch sensation  Normal  V,VII: corneal reflex  Present  VII: facial muscle function - upper  Normal  VII: facial muscle function - lower Normal  VIII: hearing Not tested  IX: soft palate elevation  Normal  IX,X: gag reflex Present  XI: trapezius strength  5/5  XI: sternocleidomastoid strength 5/5  XI: neck flexion strength  5/5  XII: tongue strength  Normal    Data Review Lab Results  Component Value Date   WBC 7.4 11/27/2011   HGB 14.9 11/27/2011   HCT 41.0 11/27/2011   MCV 86.1 11/27/2011   PLT 178 11/27/2011   Lab Results  Component Value Date   NA 139 11/27/2011   K 3.0* 11/27/2011   CL 98 11/27/2011   CO2 29 11/27/2011   BUN 10 11/27/2011   CREATININE 0.56 11/27/2011   GLUCOSE 353* 11/27/2011   Lab Results  Component Value Date   INR 0.93 11/27/2011    Assessment:   Cervical neck pain with pseudoarthrosis. Patient has failed conservative therapy. Planned surgery : Posterior cervical fusion  Plan:   I explained the condition and procedure to the patient and answered any questions.  Patient wishes to proceed with procedure as planned. Understands risks/ benefits/ and expected or typical outcomes.  Shannon Stone S 12/01/2011 10:57 AM

## 2011-12-01 NOTE — Progress Notes (Signed)
Spoke with Dr. Yetta Barre about CBC only done at Shannon Stone since we did not have orders. When we received paper orders CBC with Diff was on orders to do. He is okay with not getting the Diff.

## 2011-12-01 NOTE — Op Note (Signed)
12/01/2011  4:02 PM  PATIENT:  Shannon Stone  66 y.o. 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 Gerlene Fee  ANESTHESIA:   General  EBL: 100 ml  Total I/O In: 1000 [I.V.:1000] Out: -   BLOOD ADMINISTERED:none  DRAINS: None   SPECIMEN:  No Specimen  INDICATION FOR PROCEDURE: This patient presented with neck and right arm pain after a 2 level ACDF at C5-6 and C6-7  A couple of years ago. CT scan suggested a pseudoarthrosis at C5-6. I recommended a posterior cervical fusion C5-6.  Patient understood the risks, benefits, and alternatives and potential outcomes and wished to proceed.  PROCEDURE DETAILS: The patient was brought to the operating room. Generalized endotracheal anesthesia was induced. The patient was affixed a 3 point Mayfield headrest and rolled into the prone position on chest rolls. All pressure points were padded. The posterior cervical region was cleaned and prepped with DuraPrep and then draped in the usual sterile fashion. 7 cc of local anesthesia was injected and a dorsal midline incision made in the posterior cervical region and carried down to the cervical fascia. The fascia was opened and the paraspinous musculature was taken down to expose C5-6. Intraoperative fluoroscopy confirmed my level and then the dissection was carried out over the lateral facets. I localized the midpoint of each lateral mass and marked a region 1 mm medial to the midpoint of the lateral mass, and then drilled in an upper and outward direction into the safe zone of each lateral mass. I drilled to a depth of 12 mm and then checked my drill hole with a ball probe. I then placed 14 mm lateral mass screws into the safe zone of each lateral  mass until they were 2 fingers tight. I then decorticated the lateral masses and the facet joints and packed them with morcellized allograft to perform arthrodesis at C5-6. I then placed rods into the multiaxial screw heads of the screws and locked these in position with the locking caps and anti-torque device. I then checked the final construct with fluoroscopy. I irrigated with saline solution containing bacitracin. . After hemostasis was achieved a closed the muscle and the fascia with 0 Vicryl, subcutaneous tissue with 2-0 Vicryl, and the subcuticular tissue with 3-0 Vicryl. The skin was closed with benzoin and Steri-Strips. A sterile dressing was applied, the patient was turned to the supine physician and taken out of the headrest, awakened from general anesthesia and transferred to the recovery room in stable condition. At the end of the procedure all sponge, needle and instrument counts were correct.   PLAN OF CARE: Admit to inpatient   PATIENT DISPOSITION:  PACU - hemodynamically stable.   Delay start of Pharmacological VTE agent (>24hrs) due to surgical blood loss or risk of bleeding:  yes

## 2011-12-02 MED ORDER — PNEUMOCOCCAL VAC POLYVALENT 25 MCG/0.5ML IJ INJ
0.5000 mL | INJECTION | INTRAMUSCULAR | Status: DC
  Filled 2011-12-02: qty 0.5

## 2011-12-02 MED ORDER — OXYCODONE-ACETAMINOPHEN 5-325 MG PO TABS: 1.0000 | ORAL_TABLET | ORAL | Status: AC | PRN

## 2011-12-02 MED ORDER — CYCLOBENZAPRINE HCL 10 MG PO TABS: 10.0000 mg | ORAL_TABLET | Freq: Three times a day (TID) | ORAL | Status: AC | PRN

## 2011-12-02 NOTE — Discharge Instructions (Signed)
Wound Care Keep incision covered and dry for one week.  If you shower prior to then, cover incision with plastic wrap.  You may remove outer bandage after one week and shower.  Do not put any creams, lotions, or ointments on incision. Leave steri-strips on neck.  They will fall off by themselves. Activity Walk each and every day, increasing distance each day. No lifting greater than 5 lbs.  Avoid excessive neck motion. No driving for 2 weeks; may ride as a passenger locally. Wear neck brace at all times except when showering or otherwise instructed. Diet Resume your normal diet.  Return to Work Will be discussed at you follow up appointment. Call Your Doctor If Any of These Occur Redness, drainage, or swelling at the wound.  Temperature greater than 101 degrees. Severe pain not relieved by pain medication. Increased difficulty swallowing.  Incision starts to come apart. Follow Up Appt Call today for appointment in 1-2 weeks [phone]) or for problems.  If you have any hardware placed in your spine, you will need an x-ray before your appointment.   

## 2011-12-02 NOTE — Discharge Summary (Signed)
Physician Discharge Summary  Patient ID: Shannon Stone MRN: [id number] DOB/AGE: 01/15/1946 66 y.o.  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 *    Discharged Condition: good  Hospital Course: pt admitted day of surgery and underwent procedure below.  Pt with less arm pain and numbness.  Some incisional pain. Pt ambulating, voiding  Consults: None  Significant Diagnostic Studies: none  Treatments: surgery: Posterior cervical arthrodesis C5-6 with morcellized allograft and nonsegmental fixation utilizing Depew mountaineer lateral mass screws      Discharge Exam: Blood pressure 148/89, pulse 59, temperature 97.5 F (36.4 C), temperature source Oral, resp. rate 18, weight 118.5 kg (261 lb 3.9 oz), SpO2 94.00%. Wound:c/d/i  Disposition: home   Medication List  As of 12/02/2011  9:55 AM   TAKE these medications         amLODipine 10 MG tablet   Commonly known as: NORVASC   Take 10 mg by mouth daily.      aspirin EC 81 MG tablet   Take 81 mg by mouth daily.      atenolol 50 MG tablet   Commonly known as: TENORMIN   Take 50 mg by mouth daily.      chlorthalidone 25 MG tablet   Commonly known as: HYGROTON   Take 25 mg by mouth daily.      clobetasol cream 0.05 %   Commonly known as: TEMOVATE   Apply 1 application topically daily.      cyclobenzaprine 10 MG tablet   Commonly known as: FLEXERIL   Take 1 tablet (10 mg total) by mouth 3 (three) times daily as needed for muscle spasms.      FISH OIL PO   Take 1 capsule by mouth daily.      metroNIDAZOLE 0.75 % cream   Commonly known as: METROCREAM   Apply 1 application topically at bedtime.      oxyCODONE-acetaminophen 5-325 MG per tablet   Commonly known as: PERCOCET   Take 1-2 tablets by mouth every 4 (four) hours as  needed.      PARoxetine 40 MG tablet   Commonly known as: PAXIL   Take 40 mg by mouth every morning.             SignedClydene Fake, MD 12/02/2011, 9:55 AM

## 2011-12-04 ENCOUNTER — Encounter (HOSPITAL_COMMUNITY): Payer: Self-pay | Admitting: Neurological Surgery

## 2011-12-04 LAB — GLUCOSE, CAPILLARY

## 2011-12-09 ENCOUNTER — Emergency Department (HOSPITAL_COMMUNITY)
Admission: EM | Admit: 2011-12-09 | Discharge: 2011-12-09 | Disposition: A | Discharge status: Home / Self Care | Payer: BC Managed Care – PPO | Source: Home / Self Care | Attending: Emergency Medicine | Admitting: Emergency Medicine

## 2011-12-09 ENCOUNTER — Encounter (HOSPITAL_COMMUNITY): Payer: Self-pay | Admitting: *Deleted

## 2011-12-09 DIAGNOSIS — Z4801 Encounter for change or removal of surgical wound dressing: Secondary | ICD-10-CM

## 2011-12-09 LAB — DIFFERENTIAL
Basophils Relative: 0 % (ref 0–1)
Eosinophils Absolute: 0.2 10*3/uL (ref 0.0–0.7)
Eosinophils Relative: 2 % (ref 0–5)
Lymphs Abs: 3.2 10*3/uL (ref 0.7–4.0)
Monocytes Absolute: 0.8 10*3/uL (ref 0.1–1.0)
Monocytes Relative: 7 % (ref 3–12)
Neutrophils Relative %: 63 % (ref 43–77)

## 2011-12-09 LAB — CBC
HCT: 44.6 % (ref 39.0–52.0)
Hemoglobin: 16.6 g/dL (ref 13.0–17.0)
MCH: 31.8 pg (ref 26.0–34.0)
MCHC: 37.2 g/dL — ABNORMAL HIGH (ref 30.0–36.0)
MCV: 85.4 fL (ref 78.0–100.0)

## 2011-12-09 MED ORDER — CEPHALEXIN 500 MG PO CAPS: 500.0000 mg | ORAL_CAPSULE | Freq: Three times a day (TID) | ORAL | Status: AC

## 2011-12-09 NOTE — ED Provider Notes (Signed)
Chief Complaint  Patient presents with  . Wound Check    History of Present Illness:   Shannon Stone is a 66 year old male who underwent cervical fusion by a posterior approach 8 days ago. This was done by Dr. Marikay Alar. Since then the lower end of the wound has been oozing some clear fluid. There has been no pus. He has some soreness stiffness and pain following the surgery, but is not getting any worse. He denies any headache, fever, or chills. He has had a couple sweats. He has some nausea about 5 or 6 days ago, but this seemed to get better. He denies any numbness, tingling, or weakness in the upper extremities. No upper extremity pain.  Review of Systems:  Other than noted above, the patient denies any of the following symptoms: Systemic:  No fevers, chills, sweats, weight loss or gain, fatigue, or tiredness.  PMFSH:  Past medical history, family history, social history, meds, and allergies were reviewed.  Physical Exam:   Vital signs:  BP 151/87  Pulse 72  Temp(Src) 98.8 F (37.1 C) (Oral)  Resp 20  SpO2 98% General:  Alert and oriented.  In no distress.  Skin warm and dry. Neck: He has a surgical wound on the posterior neck. The lower end of this had not completely closed up yet and wasn't losing some serosanguineous fluid in. There was no pus. There is minimal surrounding erythema, not much tenderness to palpation. Neuro exam: DTRs, strength, and sensation were normal in the upper extremities.  Labs:   Results for orders placed during the hospital encounter of 12/09/11  CBC      Component Value Range   WBC 11.4 (*) 4.0 - 10.5 (K/uL)   RBC 5.22  4.22 - 5.81 (MIL/uL)   Hemoglobin 16.6  13.0 - 17.0 (g/dL)   HCT 91.4  78.2 - 95.6 (%)   MCV 85.4  78.0 - 100.0 (fL)   MCH 31.8  26.0 - 34.0 (pg)   MCHC 37.2 (*) 30.0 - 36.0 (g/dL)   RDW 21.3  08.6 - 57.8 (%)   Platelets 246  150 - 400 (K/uL)  DIFFERENTIAL      Component Value Range   Neutrophils Relative 63  43 - 77 (%)   Neutro  Abs 7.2  1.7 - 7.7 (K/uL)   Lymphocytes Relative 28  12 - 46 (%)   Lymphs Abs 3.2  0.7 - 4.0 (K/uL)   Monocytes Relative 7  3 - 12 (%)   Monocytes Absolute 0.8  0.1 - 1.0 (K/uL)   Eosinophils Relative 2  0 - 5 (%)   Eosinophils Absolute 0.2  0.0 - 0.7 (K/uL)   Basophils Relative 0  0 - 1 (%)   Basophils Absolute 0.0  0.0 - 0.1 (K/uL)    Other Labs Obtained at Urgent Care Center:  The drainage was cultured.  Results are pending at this time and we will call about any positive results.  Course in Urgent Care Center:   I called his neurosurgeon, and the on-call covering physician, Dr. Maeola Harman, suggested dressing changes and cephalexin. He also suggested following up early next week with Dr. Yetta Barre.  Assessment:  The encounter diagnosis was Dressing change or removal, surgical wound.  Plan:   1.  The following meds were prescribed:   New Prescriptions   CEPHALEXIN (KEFLEX) 500 MG CAPSULE    Take 1 capsule (500 mg total) by mouth 3 (three) times daily.   2.  The patient was  instructed in symptomatic care and handouts were given. 3.  The patient was told to return if becoming worse in any way, if no better in 3 or 4 days, and given some red flag symptoms that would indicate earlier return. He was instructed to cleanse this daily with soap and water, pat dry, apply antibiotic ointment, and gauze 4x4 dressings twice daily. He will followup with his neurosurgeon early next week. I told him if he develops any fever or severe headache to go directly to the emergency room.     Shannon Likes, MD 12/09/11 2052

## 2011-12-09 NOTE — Discharge Instructions (Signed)
Wash with soap and water, pat dry, apply antibiotic ointment and cover with several layers of gauze (4 by 4s).    Change dressing twice daily.  Follow up with Dr. Yetta Barre Monday.  If any fever or severe headache, return here or to emergency room.

## 2011-12-09 NOTE — ED Notes (Signed)
S/P posterior cervical spine surgery performed by Dr. Marikay Alar on 4/18; states as soon as he was discharged to home the following day, started w/ significant drainage from wound.  Drainage amount has decreased, but continues with oozing.  Small amount serous drainage noted to distal aspect of wound; otherwise, wound well approximated with one steri-strip in place to proximal end.  Also c/o "funny taste in my mouth" and bilat tinnitus every since discontinuing oxycodone and cyclobenzaprine on Tuesday (quit taking due to hallucinations).  Has been trying to get in touch with Dr. Yetta Barre office, "but no one will call me back".

## 2011-12-14 LAB — WOUND CULTURE: Gram Stain: NONE SEEN

## 2011-12-14 NOTE — ED Notes (Signed)
Wound culture posterior cervical spine: Few staph species  (Coag. Neg.).  Lab shown to Dr. Artis Flock and he said it was skin contaminant. No further action needed.

## 2012-06-10 DIAGNOSIS — M5412 Radiculopathy, cervical region: Secondary | ICD-10-CM | POA: Diagnosis not present

## 2012-06-14 ENCOUNTER — Other Ambulatory Visit: Payer: Self-pay | Admitting: Neurological Surgery

## 2012-06-14 DIAGNOSIS — M5412 Radiculopathy, cervical region: Secondary | ICD-10-CM

## 2012-06-18 ENCOUNTER — Ambulatory Visit
Admission: RE | Admit: 2012-06-18 | Discharge: 2012-06-18 | Disposition: A | Discharge status: Home / Self Care | Payer: Medicare Other | Source: Ambulatory Visit | Attending: Neurological Surgery | Admitting: Neurological Surgery

## 2012-06-18 VITALS — BP 140/77 | HR 52

## 2012-06-18 DIAGNOSIS — M5412 Radiculopathy, cervical region: Secondary | ICD-10-CM

## 2012-06-18 DIAGNOSIS — M47812 Spondylosis without myelopathy or radiculopathy, cervical region: Secondary | ICD-10-CM | POA: Diagnosis not present

## 2012-06-18 DIAGNOSIS — M4802 Spinal stenosis, cervical region: Secondary | ICD-10-CM | POA: Diagnosis not present

## 2012-06-18 DIAGNOSIS — IMO0002 Reserved for concepts with insufficient information to code with codable children: Secondary | ICD-10-CM | POA: Diagnosis not present

## 2012-06-18 MED ORDER — IOHEXOL 300 MG/ML  SOLN
10.0000 mL | Freq: Once | INTRAMUSCULAR | Status: AC | PRN
  Administered 2012-06-18: 10 mL via INTRATHECAL

## 2012-06-18 MED ORDER — DIAZEPAM 5 MG PO TABS
5.0000 mg | ORAL_TABLET | Freq: Once | ORAL | Status: AC
  Administered 2012-06-18: 5 mg via ORAL

## 2012-08-05 DIAGNOSIS — M5412 Radiculopathy, cervical region: Secondary | ICD-10-CM | POA: Diagnosis not present

## 2012-08-15 ENCOUNTER — Telehealth: Payer: Self-pay | Admitting: Family Medicine

## 2012-08-15 NOTE — Telephone Encounter (Signed)
Okay--however he cannot be seen until after his records are received here (since I need to see date of when I last saw him, to know if is < or > than 3 years, to know how to bill (old vs new pt), and to assist with providing good care)--appt will need to be rescheduled if records not received, so pt should verify we received them prior to appt.

## 2012-08-15 NOTE — Telephone Encounter (Signed)
This is a former pt of yours. He is interested in becoming a pt here. Pt has medicare and [employer] [place] supplement as insurance. Please let me know.

## 2012-08-16 NOTE — Telephone Encounter (Signed)
Pt was called and informed that we would take him on as a new pt. Pt is to stop by and fill out medical records release. Pt was informed that records must be in hand in order to make an appointment.

## 2012-09-17 DIAGNOSIS — H353190 Nonexudative age-related macular degeneration, unspecified eye, stage unspecified: Secondary | ICD-10-CM | POA: Diagnosis not present

## 2012-09-24 DIAGNOSIS — H353190 Nonexudative age-related macular degeneration, unspecified eye, stage unspecified: Secondary | ICD-10-CM | POA: Diagnosis not present

## 2012-10-01 ENCOUNTER — Encounter: Payer: Self-pay | Admitting: *Deleted

## 2012-12-11 DIAGNOSIS — H9110 Presbycusis, unspecified ear: Secondary | ICD-10-CM | POA: Diagnosis not present

## 2012-12-11 DIAGNOSIS — H833X9 Noise effects on inner ear, unspecified ear: Secondary | ICD-10-CM | POA: Diagnosis not present

## 2013-03-17 ENCOUNTER — Ambulatory Visit (INDEPENDENT_AMBULATORY_CARE_PROVIDER_SITE_OTHER): Payer: Medicare Other | Admitting: Family Medicine

## 2013-03-17 ENCOUNTER — Encounter: Payer: Self-pay | Admitting: Family Medicine

## 2013-03-17 VITALS — BP 154/80 | HR 60 | Temp 98.1°F | Ht 66.0 in | Wt 244.0 lb

## 2013-03-17 DIAGNOSIS — I10 Essential (primary) hypertension: Secondary | ICD-10-CM | POA: Diagnosis not present

## 2013-03-17 DIAGNOSIS — R5383 Other fatigue: Secondary | ICD-10-CM | POA: Diagnosis not present

## 2013-03-17 DIAGNOSIS — R5381 Other malaise: Secondary | ICD-10-CM | POA: Diagnosis not present

## 2013-03-17 DIAGNOSIS — Z125 Encounter for screening for malignant neoplasm of prostate: Secondary | ICD-10-CM | POA: Diagnosis not present

## 2013-03-17 DIAGNOSIS — R7301 Impaired fasting glucose: Secondary | ICD-10-CM

## 2013-03-17 DIAGNOSIS — F329 Major depressive disorder, single episode, unspecified: Secondary | ICD-10-CM

## 2013-03-17 DIAGNOSIS — G4733 Obstructive sleep apnea (adult) (pediatric): Secondary | ICD-10-CM

## 2013-03-17 DIAGNOSIS — E7800 Pure hypercholesterolemia, unspecified: Secondary | ICD-10-CM

## 2013-03-17 DIAGNOSIS — F411 Generalized anxiety disorder: Secondary | ICD-10-CM

## 2013-03-17 DIAGNOSIS — R635 Abnormal weight gain: Secondary | ICD-10-CM

## 2013-03-17 DIAGNOSIS — E669 Obesity, unspecified: Secondary | ICD-10-CM

## 2013-03-17 MED ORDER — AMLODIPINE BESYLATE 10 MG PO TABS: 10.0000 mg | ORAL_TABLET | Freq: Every day | ORAL | Status: DC

## 2013-03-17 MED ORDER — PAROXETINE HCL 40 MG PO TABS: 40.0000 mg | ORAL_TABLET | ORAL | Status: DC

## 2013-03-17 MED ORDER — ATENOLOL-CHLORTHALIDONE 50-25 MG PO TABS: 1.0000 | ORAL_TABLET | Freq: Every day | ORAL | Status: DC

## 2013-03-17 NOTE — Progress Notes (Signed)
Chief Complaint  Patient presents with  . Establish Care    former patient/new patient re establish care. Is in need of medication refills.    Patient presents to re-establish care. He was last seen by me in 02/2010 at [facility], and has continued his care there with Dr. Zenaida Niece and Dr. Jillyn Hidden.  Old records have been received and reviewed.  HTN:  When taking both medications, his blood pressures would run 120-130/70-80. He ran out of amlodipine this morning, and tenoretic 2 days ago.  He denies dizziness, exertional chest pain, palpitations.  He had been walking 3 miles (45 minutes) daily, but he stopped 3 months ago, and has gained about 35 pounds since he stopped walking.  Denies any other changes to his diet.  He has some headaches related to chronic neck pain.  Impaired fasting glucose/borderline DM.  Last check was at least a year ago per pt.  Review of chart shows A1c 5.3 04/2012 (when weight was 216#).  Occasional increased frequency of void, but no excessive thirst.  Sees ophtho twice a year (macular degeneration)  Hyperlipidemia--at one point was on statin medication, but had to stop due to myalgias.  Believes it was Lipitor.  Never took other statins. Lipids from 11/2011 Total chol 159, TG 91, HDL 38, LDL 106, ratio 4.2 (wt was 251# at the time).  OSA--previously on CPAP.  Currently is using an oral appliance he got from dentist.  He denies feeling unrefreshed or significant daytime somnolence.  Sometimes feels tired in the afternoons.  Alcoholism--he has been sober since doing inpatient rehab at [facility] in 2010.  He is active in [place], attending daily meetings, as well as volunteering at [facility].  Depression/anxiety:  He reports that his moods are good, and denies side effects from his medication.  Past Medical History  Diagnosis Date  . Complication of anesthesia     DIFFICULTY URINATING  . Hypertension   . 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 Anxiety   .  Sleep apnea     (done at [facility]); central and obstructive; using device from dentist  . 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 Depression     and anxiety  . Esophageal reflux   . Urgency of urination   . Nocturia   . Impotence of organic origin     erectile dysfunction--has seen Dr. Aldean Ast  . Psoriasis   . Rosacea   . Dry eye   . Diabetes mellitus without complication     history of borderline DM/elevated fasting glucose.  . Arthritis     OA-knees, elbow  . Other and unspecified hyperlipidemia     h/o elevated cholesterol  . Elevated LFTs     (ETOH)fatty infiltration on u/s  . Diverticulosis     sigmoid(5/08)  . Neck pain     cervical radiculopathy-Dr. Marikay Alar; persistent, s/p surgery  . Alcohol dependence     history of abuse/dependence-s/p inpatient rehab at [facility] 08/2008  . Macular degeneration   . Microscopic hematuria     eval by Dr. Aldean Ast  . 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 Periodic limb movements of sleep     per sleep study 11/02   Past Surgical History  Procedure Laterality Date  . Cervical spine surgery  2011, 2013    Dr. Marikay Alar  .  Knee surgery Left 2013  . Umbilical hernia repair      AS CHILD   . Posterior cervical fusion/foraminotomy  12/01/2011    Procedure: POSTERIOR CERVICAL FUSION/FORAMINOTOMY LEVEL 1;  Surgeon: Tia Alert, MD;  Location: [facility];  Service: Neurosurgery;  Laterality: Bilateral;  Cervical five-six posterior cervical fusion    History   Social History  . Marital Status: Single    Spouse Name: N/A    Number of Children: N/A  . Years of Education: N/A   Occupational History  . retired Statistician)    Social History Main Topics  . Smoking status: Former Smoker -- 1.00 packs/day for 18 years    Types: Cigarettes    Quit date: 08/14/1990  . Smokeless tobacco: Never Used  . Alcohol Use: Yes     Comment: zero drinks in 5 years.  sober since  08/26/2008  . Drug Use: No  . Sexually Active: Not Currently   Other Topics Concern  . Not on file   Social History Narrative   Divorced.  Lives alone, 1 cat.  Retired Barrister's clerk.  Alcoholic--sober since 08/2009.  Active in AA, daily meetings, and he volunteers at [facility] (where he did rehab)   Family History  Problem Relation Age of Onset  . Arthritis Mother   . Hypertension Mother   . Alcohol abuse Mother   . CVA Mother   . Hypertension Father   . Asthma Father   . Cancer Paternal Grandfather     stomach  . Hodgkin's lymphoma Sister     history of  . Cancer Maternal Uncle     stomach  . Parkinson's disease Paternal Grandmother   . Diabetes Other     amputee   Current outpatient prescriptions:aspirin EC 81 MG tablet, Take 81 mg by mouth daily., Disp: , Rfl: ;  Multiple Vitamins-Minerals (PRESERVISION AREDS PO), Take 2 tablets by mouth daily., Disp: , Rfl: ;  Omega-3 Fatty Acids (FISH OIL PO), Take 1 capsule by mouth daily., Disp: , Rfl: ;  PARoxetine (PAXIL) 40 MG tablet, Take 40 mg by mouth every morning., Disp: , Rfl:  Propylene Glycol (SYSTANE BALANCE OP), Apply 2 drops to eye daily. 1 drop in each eye once a day., Disp: , Rfl: ;  sodium chloride (OCEAN) 0.65 % nasal spray, Place 1 spray into the nose as needed for congestion., Disp: , Rfl: ;  amLODipine (NORVASC) 10 MG tablet, Take 10 mg by mouth daily., Disp: , Rfl: ;  atenolol-chlorthalidone (TENORETIC) 50-25 MG per tablet, Take 1 tablet by mouth daily., Disp: , Rfl:  clobetasol cream (TEMOVATE) 0.05 %, Apply 1 application topically daily., Disp: , Rfl: ;  metroNIDAZOLE (METROCREAM) 0.75 % cream, Apply 1 application topically at bedtime., Disp: , Rfl:   Hasn't been needing to use steroid cream for psoriasis.  Only uses the metrocream prn for flares.  Allergies  Allergen Reactions  . Betadine (Povidone Iodine) Rash   ROS:  +weight gain as per HPI.  No fevers, chills, URI symptoms. He has some mild allergies  with nasal congestion and dryness, relieved by using nasal saline prn. 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  Seeing ENT, and will decide in September if he needs hearing aid.  Chronic neck pain.  On right side feels tight muscles in shoulder, and radiates into arm and into chest.  On left side, pain sometimes radiates up into his head. Denies exertional chest pain, dizziness,palpitations, shortness of breath, nausea, vomiting, heartburn, edema, bleeding/bruising rashes (  psoriasis controlled/stable currently) or other complaints. See HPI  PHYSICAL EXAM: BP 142/80  Pulse 60  Temp(Src) 98.1 F (36.7 C) (Oral)  Ht 5\' 6"  (1.676 m)  Wt 244 lb (110.678 kg)  BMI 39.4 kg/m2 154/80 on repeat by MD, RA Pleasant obese male, [place] accent, in no distress HEENT:  PERRL ,EOMI, conjunctiva clear.  Nasal mucosa mildly edematous, no erythema.  Sinuses nontender.  OP clear Neck: no lymphadenopathy, thyromegaly or carotid bruit.  c-spine nontender.  Mild tenderness R>L trapezius muscle Heart: regular rate and rhythm without murmur Lungs: clear bilaterally Back: no spine or CVA tenderness Abdomen: obese.  Soft, nontender, no mass Extremities: no edema.  2+ pulse.   Neuro: alert and oriented. Cranial nerves intact. Normal gait, DTR's Psych: normal mood, affect, hygiene, grooming, speech and eye contact.  ASSESSMENT/PLAN:  Essential hypertension, benign - Plan: Comprehensive metabolic panel, atenolol-chlorthalidone (TENORETIC) 50-25 MG per tablet, amLODipine (NORVASC) 10 MG tablet  Weight gain - Plan: TSH  Other malaise and fatigue - Plan: TSH, CBC with Differential  Special screening for malignant neoplasm of prostate - Plan: PSA, Medicare  Pure hypercholesterolemia - Plan: Lipid panel  Impaired fasting glucose - Plan: Hemoglobin A1c  Depressive disorder, not elsewhere classified - Plan: PARoxetine (PAXIL) 40 MG tablet  Anxiety state, unspecified - Plan: PARoxetine (PAXIL) 40 MG tablet  OSA (obstructive sleep  apnea)  Obesity (BMI 30-39.9)  Return tomorrow for fasting labs. Restart a walking program. Start slowly, twice daily at shorter intervals.  Weight loss encouraged. OSA--? Efficiency of treatment with his current device vs CPAP.  Asymptomatic.  Weight loss encouraged  HTN--controlled when on his meds.  Refilled meds, continue to monitor.  Exercise and weight loss IFG--check labs H/o elevated cholesterol--check labs Depression/anxiety--controlled Alcoholism--actively involved in AA.  Congratulated on his abstinence Chronic neck pain--s/p surgery.  Some is clearly MSK.  Only briefly discussed.  Consider sooner appt than CPE for referral for PT and full eval of neck pain.  F/u at [facility] physical/med check Return for fasting labs tomorrow

## 2013-03-17 NOTE — Patient Instructions (Addendum)
Restart a walking program. Start slowly, twice daily at shorter intervals.  Please try and watch your portion sizes and eat healthy in order to lose weight.  Return tomorrow for fasting labs.  We may need to restart you on cholesterol-lowering medications.  We will avoid lipitor  Return for [facility] physical in the next few months.  Take all of your medications as prescribed, and periodically check your blood pressure to make sure it gets back to goal (less than 135/85)

## 2013-03-18 ENCOUNTER — Other Ambulatory Visit: Payer: Medicare Other

## 2013-03-18 ENCOUNTER — Encounter: Payer: Self-pay | Admitting: Family Medicine

## 2013-03-18 DIAGNOSIS — I10 Essential (primary) hypertension: Secondary | ICD-10-CM | POA: Insufficient documentation

## 2013-03-18 DIAGNOSIS — R7301 Impaired fasting glucose: Secondary | ICD-10-CM

## 2013-03-18 DIAGNOSIS — Z125 Encounter for screening for malignant neoplasm of prostate: Secondary | ICD-10-CM | POA: Diagnosis not present

## 2013-03-18 DIAGNOSIS — E669 Obesity, unspecified: Secondary | ICD-10-CM | POA: Insufficient documentation

## 2013-03-18 DIAGNOSIS — F329 Major depressive disorder, single episode, unspecified: Secondary | ICD-10-CM | POA: Insufficient documentation

## 2013-03-18 DIAGNOSIS — F411 Generalized anxiety disorder: Secondary | ICD-10-CM | POA: Insufficient documentation

## 2013-03-18 DIAGNOSIS — E7800 Pure hypercholesterolemia, unspecified: Secondary | ICD-10-CM

## 2013-03-18 DIAGNOSIS — R5383 Other fatigue: Secondary | ICD-10-CM | POA: Diagnosis not present

## 2013-03-18 DIAGNOSIS — R635 Abnormal weight gain: Secondary | ICD-10-CM

## 2013-03-18 DIAGNOSIS — G4733 Obstructive sleep apnea (adult) (pediatric): Secondary | ICD-10-CM | POA: Insufficient documentation

## 2013-03-18 DIAGNOSIS — R5381 Other malaise: Secondary | ICD-10-CM | POA: Diagnosis not present

## 2013-03-18 LAB — CBC WITH DIFFERENTIAL/PLATELET
Basophils Absolute: 0 10*3/uL (ref 0.0–0.1)
Basophils Relative: 0 % (ref 0–1)
Hemoglobin: 15.4 g/dL (ref 13.0–17.0)
Lymphocytes Relative: 38 % (ref 12–46)
MCHC: 35.4 g/dL (ref 30.0–36.0)
Monocytes Relative: 7 % (ref 3–12)
Neutro Abs: 3.8 10*3/uL (ref 1.7–7.7)
Neutrophils Relative %: 53 % (ref 43–77)
WBC: 7.2 10*3/uL (ref 4.0–10.5)

## 2013-03-18 LAB — HEMOGLOBIN A1C
Hgb A1c MFr Bld: 6.1 % — ABNORMAL HIGH (ref <5.7)
Mean Plasma Glucose: 128 mg/dL — ABNORMAL HIGH (ref <117)

## 2013-03-19 ENCOUNTER — Other Ambulatory Visit: Payer: Self-pay | Admitting: *Deleted

## 2013-03-19 DIAGNOSIS — E876 Hypokalemia: Secondary | ICD-10-CM

## 2013-03-19 LAB — LIPID PANEL
HDL: 41 mg/dL (ref >39)
LDL Cholesterol: 96 mg/dL (ref 0–99)

## 2013-03-19 LAB — COMPREHENSIVE METABOLIC PANEL
Albumin: 4.1 g/dL (ref 3.5–5.2)
Alkaline Phosphatase: 48 U/L (ref 39–117)
BUN: 19 mg/dL (ref 6–23)
CO2: 32 mEq/L (ref 19–32)
Calcium: 9.3 mg/dL (ref 8.4–10.5)
Chloride: 98 mEq/L (ref 96–112)
Glucose, Bld: 163 mg/dL — ABNORMAL HIGH (ref 70–99)
Potassium: 3 mEq/L — ABNORMAL LOW (ref 3.5–5.3)
Sodium: 141 mEq/L (ref 135–145)
Total Protein: 6.5 g/dL (ref 6.0–8.3)

## 2013-03-19 LAB — PSA, MEDICARE: PSA: 2.68 ng/mL (ref <=4.00)

## 2013-03-19 LAB — TSH: TSH: 2.251 u[IU]/mL (ref 0.350–4.500)

## 2013-03-19 MED ORDER — POTASSIUM CHLORIDE CRYS ER 20 MEQ PO TBCR: 20.0000 meq | EXTENDED_RELEASE_TABLET | Freq: Every day | ORAL | Status: DC

## 2013-03-31 ENCOUNTER — Ambulatory Visit (INDEPENDENT_AMBULATORY_CARE_PROVIDER_SITE_OTHER): Payer: Medicare Other | Admitting: Family Medicine

## 2013-03-31 ENCOUNTER — Other Ambulatory Visit: Payer: Medicare Other

## 2013-03-31 ENCOUNTER — Encounter: Payer: Self-pay | Admitting: Family Medicine

## 2013-03-31 ENCOUNTER — Ambulatory Visit: Payer: Medicare Other | Admitting: Family Medicine

## 2013-03-31 DIAGNOSIS — T6391XA Toxic effect of contact with unspecified venomous animal, accidental (unintentional), initial encounter: Secondary | ICD-10-CM | POA: Diagnosis not present

## 2013-03-31 NOTE — Progress Notes (Signed)
Chief Complaint  Patient presents with  . Bee stings    Saturday afternoon he got about 10 yellow jacket bites on his legs and one on his right thumb. They are no longer painful just extremely itchy. Also wanted to know if K+ could be making him sleepy.    2 days ago, was stung by yellow jackets while mowing his hard.  He had multiple stings on his legs/ankles, at his waist and one on his finger.  He has been using topical benadryl.  It was painful initially, but now is just itchy. He has been scratching a lot.  Denies any h/o allergic reactions.  Denies any mouth/tongue swelling, shortness of breath, wheezing  He notes that he has been a little more tired since taking the potassium, needing a 2 hr nap in the afternoon, but then having a harder time falling asleep that night.  He is now taking the potassium at night, and slept very well yesterday, currently feels great, denies fatigue.  Past Medical History  Diagnosis Date  . Complication of anesthesia     DIFFICULTY URINATING  . Hypertension   . 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 Anxiety   . Sleep apnea     (done at [facility]); central and obstructive; using device from dentist  . 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 Depression     and anxiety  . Esophageal reflux   . Urgency of urination   . Nocturia   . Impotence of organic origin     erectile dysfunction--has seen Dr. Aldean Ast  . Psoriasis   . Rosacea   . Dry eye   . Diabetes mellitus without complication     history of borderline DM/elevated fasting glucose.  . Arthritis     OA-knees, elbow  . Other and unspecified hyperlipidemia     h/o elevated cholesterol  . Elevated LFTs     (ETOH)fatty infiltration on u/s  . Diverticulosis     sigmoid(5/08)  . Neck pain     cervical radiculopathy-Dr. Marikay Alar; persistent, s/p surgery  . Alcohol dependence     history of abuse/dependence-s/p inpatient rehab at [facility] 08/2008  .  Macular degeneration   . Microscopic hematuria     eval by Dr. Aldean Ast  . 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 Periodic limb movements of sleep     per sleep study 11/02   Past Surgical History  Procedure Laterality Date  . Cervical spine surgery  2011, 2013    Dr. Marikay Alar  . Knee surgery Left 2013  . Umbilical hernia repair      AS CHILD   . Posterior cervical fusion/foraminotomy  12/01/2011    Procedure: POSTERIOR CERVICAL FUSION/FORAMINOTOMY LEVEL 1;  Surgeon: Tia Alert, MD;  Location: [facility];  Service: Neurosurgery;  Laterality: Bilateral;  Cervical five-six posterior cervical fusion    History   Social History  . Marital Status: Single    Spouse Name: N/A    Number of Children: N/A  . Years of Education: N/A   Occupational History  . retired Statistician)    Social History Main Topics  . Smoking status: Former Smoker -- 1.00 packs/day for 18 years    Types: Cigarettes    Quit date: 08/14/1990  . Smokeless tobacco: Never Used  . Alcohol Use: Yes  Comment: zero drinks in 5 years.  sober since 08/26/2008  . Drug Use: No  . Sexual Activity: Not Currently   Other Topics Concern  . Not on file   Social History Narrative   Divorced.  Lives alone, 1 cat.  Retired Barrister's clerk.  Alcoholic--sober since 08/2009.  Active in AA, daily meetings, and he volunteers at [facility] (where he did rehab)   Current outpatient prescriptions:amLODipine (NORVASC) 10 MG tablet, Take 1 tablet (10 mg total) by mouth daily., Disp: 90 tablet, Rfl: 1;  aspirin EC 81 MG tablet, Take 81 mg by mouth daily., Disp: , Rfl: ;  atenolol-chlorthalidone (TENORETIC) 50-25 MG per tablet, Take 1 tablet by mouth daily., Disp: 90 tablet, Rfl: 1;  Multiple Vitamins-Minerals (PRESERVISION AREDS PO), Take 2 tablets by mouth daily., Disp: , Rfl:  Omega-3 Fatty Acids (FISH OIL PO), Take 1 capsule by mouth daily., Disp: , Rfl: ;  PARoxetine (PAXIL) 40 MG  tablet, Take 1 tablet (40 mg total) by mouth every morning., Disp: 90 tablet, Rfl: 1;  potassium chloride SA (K-DUR,KLOR-CON) 20 MEQ tablet, Take 20 mEq by mouth daily., Disp: , Rfl: ;  Propylene Glycol (SYSTANE BALANCE OP), Apply 2 drops to eye daily. 1 drop in each eye once a day., Disp: , Rfl:  sodium chloride (OCEAN) 0.65 % nasal spray, Place 1 spray into the nose as needed for congestion., Disp: , Rfl: ;  clobetasol cream (TEMOVATE) 0.05 %, Apply 1 application topically daily., Disp: , Rfl: ;  metroNIDAZOLE (METROCREAM) 0.75 % cream, Apply 1 application topically at bedtime., Disp: , Rfl:   Allergies  Allergen Reactions  . Betadine [Povidone Iodine] Rash   ROS:  Denies fevers, URI symptoms, chest pain, shortness of breath, edema, bleeding, bruising, or other concerns except as per HPI  PHYSICAL EXAM: BP 140/70  Pulse 76  Ht 5\' 6"  (1.676 m)  Wt 241 lb (109.317 kg)  BMI 38.92 kg/m2 Pleasant male in no distress, mildly anxious.  Skin: Scabs are present in multiple locations in lower extremities and at waistline on his back.  There is no surrounding erythema, warmth or streaks.  There is only mild swelling.  Nontender, no fluctuance HEENT:  OP clear with moist mucus membranes, no edema Neck: no lymphadenopathy or mass Heart: regular rate and rhythm Lungs: clear without wheezing Extremities: no edema  ASSESSMENT/PLAN:  Yellow jacket sting, initial encounter - date of sting 8/16.  no evidence of allergic reaction.  multiple stings  You have not had a severe reaction to the stings.  You are scratching them to where they are scabbed, which can potentially result in infection.  You can take zyrtec to help prevent itching.  You can continue to use topical benadryl.  You can also use topical hydrocortisone cream to help with the itching.  At this point, i recommend using topical antibacterial ointment (ie bacitracin) to the scabbed areas, to prevent infection.  You likely will not develop any  more significant allergic reaction.  However, should you notice tongue or throat swelling, shortness of breath, significantly worsening swelling, then seek care at emergency room.

## 2013-03-31 NOTE — Patient Instructions (Addendum)
You have not had a severe reaction to the stings.  You are scratching them to where they are scabbed, which can potentially result in infection.  You can take zyrtec to help prevent itching.  You can continue to use topical benadryl.  You can also use topical hydrocortisone cream to help with the itching.  At this point, i recommend using topical antibacterial ointment (ie bacitracin) to the scabbed areas, to prevent infection.  You likely will not develop any more significant allergic reaction.  However, should you notice tongue or throat swelling, shortness of breath, significantly worsening swelling, then seek care at emergency room.   Bee, Wasp, or Hornet Sting Your caregiver has diagnosed you as having an insect sting. An insect sting appears as a red lump in the skin that sometimes has a tiny hole in the center, or it may have a stinger in the center of the wound. The most common stings are from wasps, hornets and bees. Individuals have different reactions to insect stings.  A normal reaction may cause pain, swelling, and redness around the sting site.  A localized allergic reaction may cause swelling and redness that extends beyond the sting site.  A large local reaction may continue to develop over the next 12 to 36 hours.  On occasion, the reactions can be severe (anaphylactic reaction). An anaphylactic reaction may cause wheezing; difficulty breathing; chest pain; fainting; raised, itchy, red patches on the skin; a sick feeling to your stomach (nausea); vomiting; cramping; or diarrhea. If you have had an anaphylactic reaction to an insect sting in the past, you are more likely to have one again. HOME CARE INSTRUCTIONS   With bee stings, a small sac of poison is left in the wound. Brushing across this with something such as a credit card, or anything similar, will help remove this and decrease the amount of the reaction. This same procedure will not help a wasp sting as they do not leave  behind a stinger and poison sac.  Apply a cold compress for 10 to 20 minutes every hour for 1 to 2 days, depending on severity, to reduce swelling and itching.  To lessen pain, a paste made of water and baking soda may be rubbed on the bite or sting and left on for 5 minutes.  To relieve itching and swelling, you may use take medication or apply medicated creams or lotions as directed.  Only take over-the-counter or prescription medicines for pain, discomfort, or fever as directed by your caregiver.  Wash the sting site daily with soap and water. Apply antibiotic ointment on the sting site as directed.  If you suffered a severe reaction:  If you did not require hospitalization, an adult will need to stay with you for 24 hours in case the symptoms return.  You may need to wear a medical bracelet or necklace stating the allergy.  You and your family need to learn when and how to use an anaphylaxis kit or epinephrine injection.  If you have had a severe reaction before, always carry your anaphylaxis kit with you. SEEK MEDICAL CARE IF:   None of the above helps within 2 to 3 days.  The area becomes red, warm, tender, and swollen beyond the area of the bite or sting.  You have an oral temperature above 102 F (38.9 C). SEEK IMMEDIATE MEDICAL CARE IF:  You have symptoms of an allergic reaction which are:  Wheezing.  Difficulty breathing.  Chest pain.  Lightheadedness or fainting.  Itchy, raised, red patches on the skin.  Nausea, vomiting, cramping or diarrhea. ANY OF THESE SYMPTOMS MAY REPRESENT A SERIOUS PROBLEM THAT IS AN EMERGENCY. Do not wait to see if the symptoms will go away. Get medical help right away. Call your local emergency services (911 in [place] DO NOT drive yourself to the hospital. MAKE SURE YOU:   Understand these instructions.  Will watch your condition.  Will get help right away if you are not doing well or get worse. Document Released: 07/31/2005  Document Revised: 10/23/2011 Document Reviewed: 01/15/2010 [facility] Patient Information 2014 [place], [place].

## 2013-04-01 ENCOUNTER — Encounter: Payer: Self-pay | Admitting: Family Medicine

## 2013-04-04 ENCOUNTER — Telehealth: Payer: Self-pay | Admitting: Family Medicine

## 2013-04-04 NOTE — Telephone Encounter (Signed)
Left a message for pt to call concerning my chart message. He would like immunizations including zostovax. Pt needs to be inform of cost and if his insurance will cover it.

## 2013-04-11 ENCOUNTER — Encounter: Payer: Self-pay | Admitting: Internal Medicine

## 2013-04-17 IMAGING — CR DG CHEST 2V
2 series · 2 of 2 positions shown · non-contrast
Comparison: 09/04/2008

CLINICAL DATA: Preoperative assessment for posterior cervical
fusion

CHEST - 2 VIEW

[view not recorded (1 of 2)]
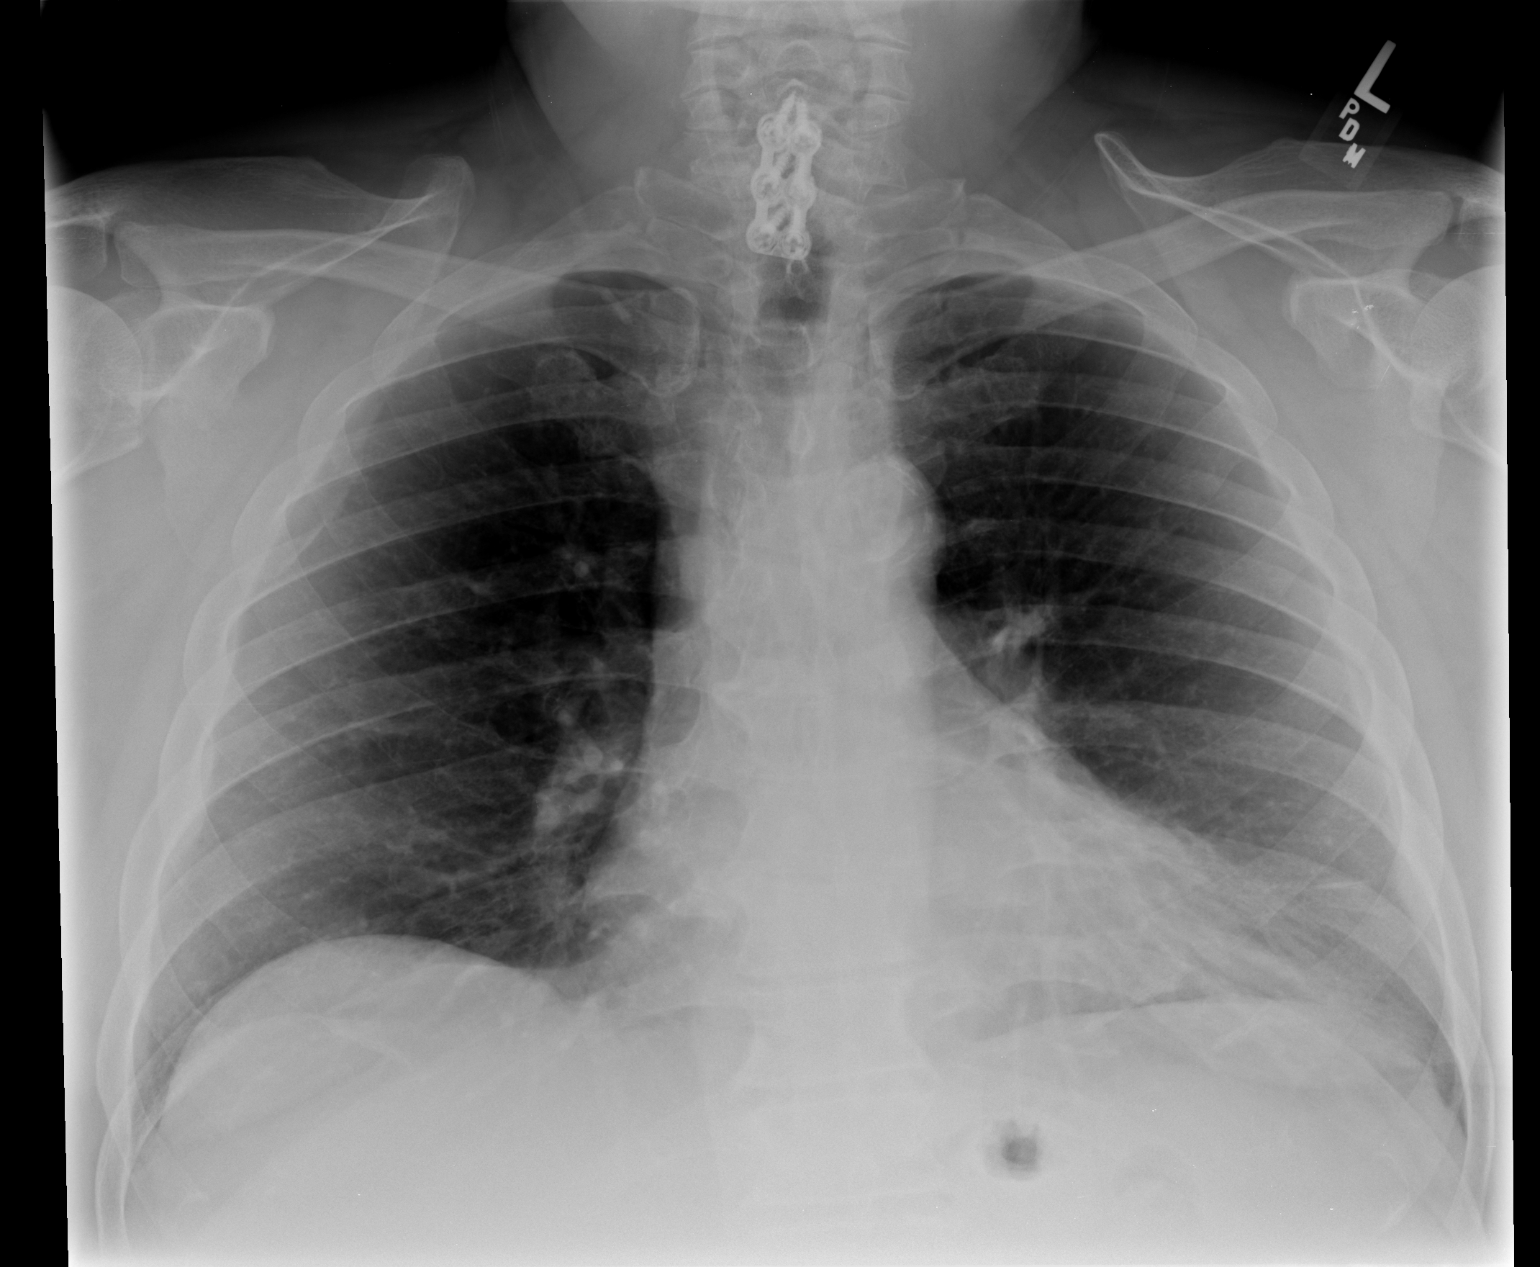

[view not recorded (2 of 2)]
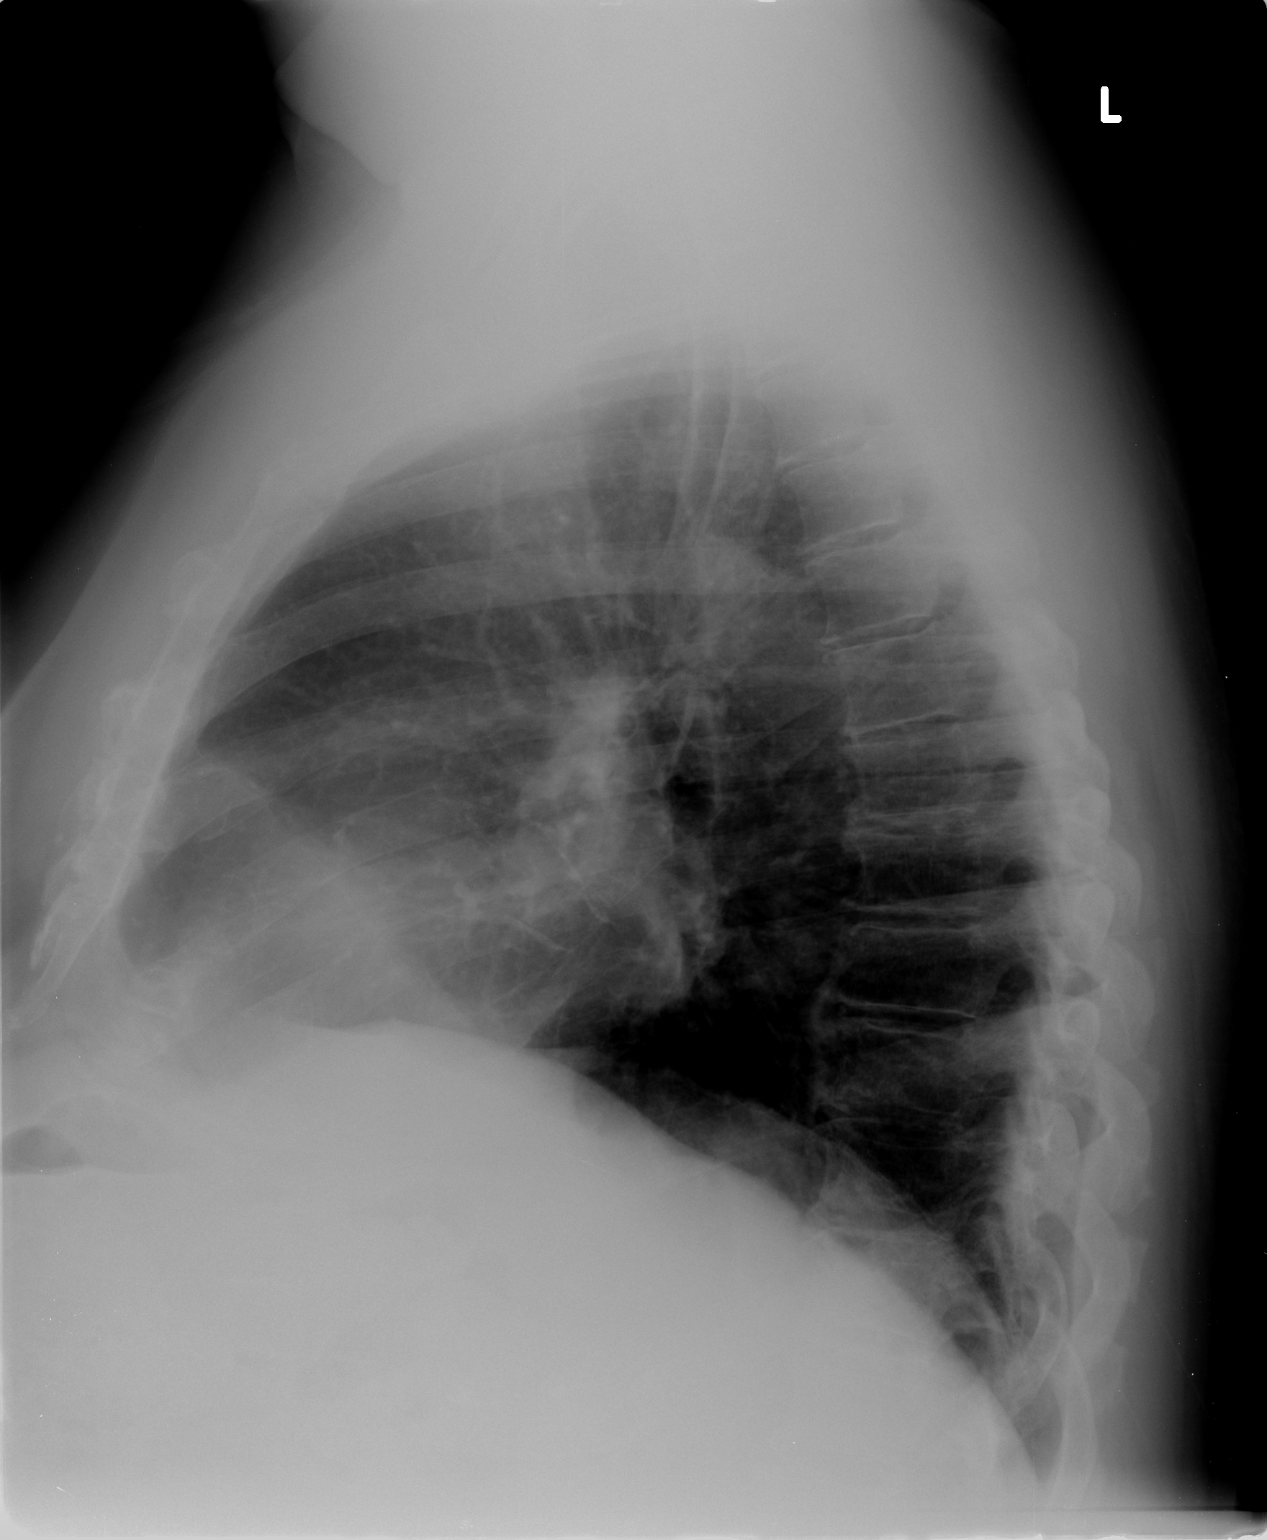

[2 of 2 positions shown; findings below may reference images not displayed]

FINDINGS: Upper-normal size of cardiac silhouette.
Mediastinal contours and pulmonary vascularity normal.
Atherosclerotic calcification aorta.
Atelectasis versus scarring left base, minimally more prominent
than on previous exam.
Remaining lungs clear.
No pleural effusion or pneumothorax.
Minimal chronic peribronchial thickening centrally.
Prior cervical spine fusion.
IMPRESSION: Chronic bronchitic changes with slightly increased atelectasis
versus scarring at left base.

## 2013-04-21 ENCOUNTER — Other Ambulatory Visit: Payer: Medicare Other

## 2013-04-21 DIAGNOSIS — E876 Hypokalemia: Secondary | ICD-10-CM | POA: Diagnosis not present

## 2013-04-21 LAB — BASIC METABOLIC PANEL
Chloride: 100 mEq/L (ref 96–112)
Potassium: 3.9 mEq/L (ref 3.5–5.3)
Sodium: 140 mEq/L (ref 135–145)

## 2013-04-24 DIAGNOSIS — H833X9 Noise effects on inner ear, unspecified ear: Secondary | ICD-10-CM | POA: Diagnosis not present

## 2013-04-24 DIAGNOSIS — H9110 Presbycusis, unspecified ear: Secondary | ICD-10-CM | POA: Diagnosis not present

## 2013-04-24 DIAGNOSIS — H905 Unspecified sensorineural hearing loss: Secondary | ICD-10-CM | POA: Diagnosis not present

## 2013-04-24 DIAGNOSIS — Z23 Encounter for immunization: Secondary | ICD-10-CM | POA: Diagnosis not present

## 2013-04-30 ENCOUNTER — Encounter: Payer: Self-pay | Admitting: *Deleted

## 2013-05-01 DIAGNOSIS — H524 Presbyopia: Secondary | ICD-10-CM | POA: Diagnosis not present

## 2013-05-01 DIAGNOSIS — H52229 Regular astigmatism, unspecified eye: Secondary | ICD-10-CM | POA: Diagnosis not present

## 2013-05-01 DIAGNOSIS — H5200 Hypermetropia, unspecified eye: Secondary | ICD-10-CM | POA: Diagnosis not present

## 2013-05-01 DIAGNOSIS — H353190 Nonexudative age-related macular degeneration, unspecified eye, stage unspecified: Secondary | ICD-10-CM | POA: Diagnosis not present

## 2013-06-13 ENCOUNTER — Other Ambulatory Visit: Payer: Self-pay | Admitting: Family Medicine

## 2013-08-06 ENCOUNTER — Encounter: Payer: Self-pay | Admitting: Family Medicine

## 2013-08-06 ENCOUNTER — Ambulatory Visit (INDEPENDENT_AMBULATORY_CARE_PROVIDER_SITE_OTHER): Payer: Medicare Other | Admitting: Family Medicine

## 2013-08-06 VITALS — BP 130/68 | HR 60 | Ht 65.0 in | Wt 239.0 lb

## 2013-08-06 DIAGNOSIS — G4733 Obstructive sleep apnea (adult) (pediatric): Secondary | ICD-10-CM

## 2013-08-06 DIAGNOSIS — Z0000 Encounter for general adult medical examination without abnormal findings: Secondary | ICD-10-CM

## 2013-08-06 DIAGNOSIS — N529 Male erectile dysfunction, unspecified: Secondary | ICD-10-CM

## 2013-08-06 DIAGNOSIS — F325 Major depressive disorder, single episode, in full remission: Secondary | ICD-10-CM

## 2013-08-06 DIAGNOSIS — R7301 Impaired fasting glucose: Secondary | ICD-10-CM | POA: Diagnosis not present

## 2013-08-06 DIAGNOSIS — Z23 Encounter for immunization: Secondary | ICD-10-CM | POA: Diagnosis not present

## 2013-08-06 DIAGNOSIS — R5381 Other malaise: Secondary | ICD-10-CM | POA: Diagnosis not present

## 2013-08-06 DIAGNOSIS — E669 Obesity, unspecified: Secondary | ICD-10-CM

## 2013-08-06 DIAGNOSIS — R110 Nausea: Secondary | ICD-10-CM | POA: Diagnosis not present

## 2013-08-06 DIAGNOSIS — E291 Testicular hypofunction: Secondary | ICD-10-CM | POA: Insufficient documentation

## 2013-08-06 DIAGNOSIS — I10 Essential (primary) hypertension: Secondary | ICD-10-CM

## 2013-08-06 DIAGNOSIS — L719 Rosacea, unspecified: Secondary | ICD-10-CM | POA: Diagnosis not present

## 2013-08-06 DIAGNOSIS — F411 Generalized anxiety disorder: Secondary | ICD-10-CM | POA: Diagnosis not present

## 2013-08-06 DIAGNOSIS — IMO0002 Reserved for concepts with insufficient information to code with codable children: Secondary | ICD-10-CM

## 2013-08-06 DIAGNOSIS — F1021 Alcohol dependence, in remission: Secondary | ICD-10-CM

## 2013-08-06 LAB — POCT URINALYSIS DIPSTICK
Bilirubin, UA: NEGATIVE
Ketones, UA: NEGATIVE

## 2013-08-06 LAB — TESTOSTERONE: Testosterone: 231 ng/dL — ABNORMAL LOW (ref 300–890)

## 2013-08-06 LAB — POCT GLYCOSYLATED HEMOGLOBIN (HGB A1C): Hemoglobin A1C: 5.5

## 2013-08-06 LAB — POCT CBG (FASTING - GLUCOSE)-MANUAL ENTRY: Glucose Fasting, POC: 125 mg/dL — AB (ref 70–99)

## 2013-08-06 NOTE — Progress Notes (Signed)
Chief Complaint  Patient presents with  . Annual Exam    fasting annual exam. Did not do eye exam as he has them done regularly for his macular degeneration. Does have some urinary frequency. Also is feeling nauseous and has some diarrhea today. Also wants to discuss his atenolol and the possibility that it could be contributing to his ED.    Shannon Stone is a 67 y.o. male who presents for a Welcome to [facility].  He is also here to f/u on his chronic medical problems.  He has the following concerns:  Last night, when getting up to go to the bathroom to void, he noticed he felt nauseated.  Has not vomited. He hasn't had diarrhea, but he feels like this is on the verge of happening today.  He declines a rectal exam, worried that he might have diarrhea. He isn't have any dizziness, URI symptoms, cough, shortness of breath, fevers, or other complaints.  Hypertension follow-up:  Blood pressures are not being checked elsewhere.  Denies dizziness, headaches, chest pain.  Complaining of some erectile dysfunction, wondering about changing his medication.  He notes that when he saw Dr. Aldean Ast in the past testosterone was low once, but normal on repeat.  Impaired fasting glucose--he was doing well with his diet, trying to limit sweets, but got Christmas candy so hasn't been as well recently.  He also had been walking 45 min daily, but nothing over the last month.  "I got fatigued and didn't want to do it".  OSA--previously on CPAP. Currently is using an oral appliance he got from dentist. He denies feeling unrefreshed or significant daytime somnolence, just some mild generalized fatigue.   Alcoholism--he has been sober since doing inpatient rehab at [facility] in 2010. He is active in [place], attending daily meetings, as well as volunteering at [facility].   Depression/anxiety: He reports that his moods are good, and denies side effects from his medication.   Other doctors caring for  patient: Ophtho: On [address], he can't recall the name Urology: Dr. Aldean Ast (not seen recently, but in past) Dermatologist: [facility] GI: at [facility] ENT:  Dr. Lazarus Salines Podiatrist: Dr. Charlsie Merles at [facility]  End of Life Issues:  He does not have a living will or healthcare power of attorney.  Depression screen:  Significant for poor energy, some trouble sleeping.  No interest in cooking for himself.  Not hopeless, helpless, said or complaining of decreased mood at all. See scanned form. ADL screen--notable for hearing and vision loss.  See scanned form.  Immunization History  Administered Date(s) Administered  . Influenza Split 05/02/2012, 04/24/2013  . Pneumococcal Polysaccharide-23 04/24/2013  . Td 08/15/2003  He looked into zostavax, but still about $187, and declines. Last colonoscopy: at least 5 years ago, got a reminder about 6 months ago that he is due (does through [facility])--looks like it was 5/08 Last PSA: 03/2013, normal Dentist: twice yearly Ophtho: regular (has macular degeneration) Exercise: nothing in the last month, previously walked 45 minutes daily.  Past Medical History  Diagnosis Date  . Complication of anesthesia     DIFFICULTY URINATING  . Hypertension   . 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 Anxiety   . Sleep apnea     (done at [facility]); central and obstructive; using device from dentist  . 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  Depression     and anxiety  . Esophageal reflux   . Urgency of urination   . Nocturia   . Impotence of organic origin     erectile dysfunction--has seen Dr. Aldean Ast  . Psoriasis   . Rosacea   . Dry eye   . Diabetes mellitus without complication     history of borderline DM/elevated fasting glucose.  . Arthritis     OA-knees, elbow  . Other and unspecified hyperlipidemia     h/o elevated cholesterol  . Elevated LFTs     (ETOH)fatty infiltration on u/s  .  Diverticulosis     sigmoid(5/08)  . Neck pain     cervical radiculopathy-Dr. Marikay Alar; persistent, s/p surgery  . Alcohol dependence     history of abuse/dependence-s/p inpatient rehab at [facility] 08/2008  . Macular degeneration   . Microscopic hematuria     eval by Dr. Aldean Ast  . 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 Periodic limb movements of sleep     per sleep study 11/02  . Plantar fasciitis     treated by Dr. Charlsie Merles (injection)    Past Surgical History  Procedure Laterality Date  . Cervical spine surgery  2011, 2013    Dr. Marikay Alar  . Knee surgery Left 2013  . Umbilical hernia repair      AS CHILD   . Posterior cervical fusion/foraminotomy  12/01/2011    Procedure: POSTERIOR CERVICAL FUSION/FORAMINOTOMY LEVEL 1;  Surgeon: Tia Alert, MD;  Location: [facility];  Service: Neurosurgery;  Laterality: Bilateral;  Cervical five-six posterior cervical fusion     History   Social History  . Marital Status: Single    Spouse Name: N/A    Number of Children: 3  . Years of Education: N/A   Occupational History  . retired Statistician)    Social History Main Topics  . Smoking status: Former Smoker -- 1.00 packs/day for 18 years    Types: Cigarettes    Quit date: 08/14/1990  . Smokeless tobacco: Never Used  . Alcohol Use: Yes     Comment: zero drinks in 5 years.  sober since 08/26/2008  . Drug Use: No  . Sexual Activity: Not Currently   Other Topics Concern  . Not on file   Social History Narrative   Divorced.  Lives alone, 1 cat.  Retired Barrister's clerk.  Alcoholic--sober since 08/2008.  Active in AA, daily meetings, and he volunteers at [facility] (where he did rehab).  2 daughters and a son (1 in [place], 1 in [place], [place], 1 in [place], [place])    Family History  Problem Relation Age of Onset  . Arthritis Mother   . Hypertension Mother   . Alcohol abuse Mother   . CVA Mother   . Hypertension Father   .  Asthma Father   . Cancer Paternal Grandfather     stomach  . Hodgkin's lymphoma Sister     history of  . Cancer Maternal Uncle     stomach  . Parkinson's disease Paternal Grandmother   . Diabetes Other     amputee    Current outpatient prescriptions:amLODipine (NORVASC) 10 MG tablet, Take 1 tablet (10 mg total) by mouth daily., Disp: 90 tablet, Rfl: 1;  aspirin EC 81 MG tablet, Take 81 mg by mouth daily., Disp: , Rfl: ;  atenolol-chlorthalidone (TENORETIC) 50-25 MG per tablet, Take 1 tablet by mouth daily., Disp: 90 tablet, Rfl:  1;  clobetasol cream (TEMOVATE) 0.05 %, Apply 1 application topically daily., Disp: , Rfl:  metroNIDAZOLE (METROCREAM) 0.75 % cream, Apply 1 application topically at bedtime., Disp: , Rfl: ;  Multiple Vitamins-Minerals (PRESERVISION AREDS PO), Take 2 tablets by mouth daily., Disp: , Rfl: ;  Omega-3 Fatty Acids (FISH OIL PO), Take 1 capsule by mouth daily., Disp: , Rfl: ;  PARoxetine (PAXIL) 40 MG tablet, Take 1 tablet (40 mg total) by mouth every morning., Disp: 90 tablet, Rfl: 1 potassium chloride SA (K-DUR,KLOR-CON) 20 MEQ tablet, TAKE 2 TABLETS BY MOUTH DAILY FOR 2 DAYS, THEN TAKE 1 TABLET BY MOUTH THEREAFTER, Disp: 30 tablet, Rfl: 5;  Propylene Glycol (SYSTANE BALANCE OP), Apply 2 drops to eye daily. 1 drop in each eye once a day., Disp: , Rfl: ;  sodium chloride (OCEAN) 0.65 % nasal spray, Place 1 spray into the nose as needed for congestion., Disp: , Rfl:   Allergies  Allergen Reactions  . Betadine [Povidone Iodine] Rash   ROS: The patient denies anorexia, fever, headaches, hoarseness, chest pain, palpitations, dizziness, syncope, dyspnea on exertion, cough, swelling, no melena, hematochezia, indigestion/heartburn, hematuria, incontinence, dysuria, genital lesions, weakness, tremor, suspicious skin lesions, abnormal bleeding/bruising, or enlarged lymph nodes. No edema.  No rashes, psoriasis controlled/stable. Moods are good overall. +nausea today, with stomach  growling, feeling like he will develop diarrhea +erectile dysfunction, +nocturia (prev eval'd by urology).  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 Seeing ENT, needs to get a hearing aid. Chronic neck pain. On right side feels tight muscles in shoulder, and radiates into arm and into chest. On left side, pain sometimes radiates up into his head.  Some pain in left lower back with prolonged walking. Some tingling in RA from ulnar neuropathy. Some mild residual weakness in his arms since prior to surgeries, R>L  PHYSICAL EXAM: BP 130/68  Pulse 60  Ht 5\' 5"  (1.651 m)  Wt 239 lb (108.41 kg)  BMI 39.77 kg/m2  General Appearance:    Alert, cooperative, no distress, appears stated age  Head:    Normocephalic, without obvious abnormality, atraumatic  Eyes:    PERRL, conjunctiva/corneas clear, EOM's intact, fundi    benign  Ears:    Normal TM's and external ear canals  Nose:   Nares normal, mucosa normal, no drainage or sinus   tenderness  Throat:   Lips, mucosa, and tongue normal; teeth and gums normal  Neck:   Supple, no lymphadenopathy;  thyroid:  no   enlargement/tenderness/nodules; no carotid   bruit or JVD  Back:    Spine nontender, no curvature, ROM normal, no CVA     tenderness  Lungs:     Clear to auscultation bilaterally without wheezes, rales or     ronchi; respirations unlabored  Chest Wall:    No tenderness or deformity   Heart:    Regular rate and rhythm, S1 and S2 normal, no murmur, rub   or gallop  Breast Exam:    No chest wall tenderness, or masses  Abdomen:     Soft, non-tender, nondistended, active bowel sounds,    no masses, no hepatosplenomegaly. +abdominal obesity  Genitalia:    Normal male external genitalia without lesions.  Testicles without masses, right larger than left.  No inguinal hernias.  Rectal:    Declined by patient  Extremities:   No clubbing, cyanosis or edema  Pulses:   2+ and symmetric all extremities  Skin:   Skin color, texture, turgor normal, no  rashes or  lesions  Lymph nodes:   Cervical, supraclavicular, and axillary nodes normal  Neurologic:   CNII-XII intact, normal strength, sensation and gait; reflexes 2+ and symmetric throughout          Psych:   Normal mood, affect, hygiene and grooming.      Chemistry      Component Value Date/Time   NA 140 04/21/2013 0913   K 3.9 04/21/2013 0913   CL 100 04/21/2013 0913   CO2 33* 04/21/2013 0913   BUN 20 04/21/2013 0913   CREATININE 0.70 04/21/2013 0913   CREATININE 0.56 11/27/2011 1057      Component Value Date/Time   CALCIUM 9.5 04/21/2013 0913   ALKPHOS 48 03/18/2013 0915   AST 17 03/18/2013 0915   ALT 22 03/18/2013 0915   BILITOT 0.7 03/18/2013 0915     Lab Results  Component Value Date   HGBA1C 6.1* 03/18/2013   Lab Results  Component Value Date   TSH 2.251 03/18/2013   Lab Results  Component Value Date   CHOL 161 03/18/2013   HDL 41 03/18/2013   LDLCALC 96 03/18/2013   TRIG 120 03/18/2013   CHOLHDL 3.9 03/18/2013   Lab Results  Component Value Date   PSA 2.68 03/18/2013   Fasting blood sugar today is 125 A1c today 5.5   ASSESSMENT/PLAN:  Welcome to Medicare preventive visit - Plan: POCT Urinalysis Dipstick, US Aorta Initial Medicare Screen, EKG 12-Lead  Essential hypertension, benign - controlled  OSA (obstructive sleep apnea) - continue use of oral device.  consider repeat sleep study if no other reason for fatigue found (ie testosterone)  Rosacea - controlled  Anxiety state, unspecified - controlled  Depression, major, in remission - controlled  Alcoholism in remission  Impaired fasting glucose - A1c improved.  encouraged to restart daily exercise, further weight loss, lowfat, low sugar/carb diet - Plan: HgB A1c, Glucose (CBG), Fasting  Other malaise and fatigue - Ddx includes low testosterone, OSA.  labs recently normal for thyroid, cbc. electrolytes (since on K) - Plan: Testosterone  Erectile dysfunction - declines change from beta blocker to ACEI, consider for future.  check  testosterone. Weight loss encouraged - Plan: Testosterone  Need for Tdap vaccination - pt aware that Medicare doesn't covered.  risks/benefits and reasons for recommendation reviewed - Plan: Tdap vaccine greater than or equal to 7yo IM  Obesity (BMI 30-39.9)  Nausea alone - possibly the start of viral syndrome.  bland diet, fluids  Hypokalemia--improved on supplementation.  Discussed PSA screening (risks/benefits), recommended at least 30 minutes of aerobic activity at least 5 days/week; proper sunscreen use reviewed; healthy diet and alcohol recommendations (less than or equal to 2 drinks/day) reviewed; regular seatbelt use; changing batteries in smoke detectors. Self-testicular exams. Immunization recommendations discussed--TdaP today.  zostavax recommended, but declined due to cost.  Understands that he needs to get at pharmacy if he changes his mind.  He declined prostate/rectal exam today due to possible illness.  Will check at his next visit/med check..  Colonoscopy recommendations reviewed--it sounds as though he is due now, as he received notification from GI's office  End of Life Issues--given forms and info on Living Will and healthcare power of attorney.  MOST form discussed and filled out.  Full Code, full care.  TdaP given today.  Patient is aware that Medicare does not cover this vaccine. Declines zostavax due to cost (would get at pharmacy, if desired)  F/u 3 mos--on weight, BP, fatigue/labs/ED, fasting sugar

## 2013-08-06 NOTE — Patient Instructions (Addendum)
  HEALTH MAINTENANCE RECOMMENDATIONS:  It is recommended that you get at least 30 minutes of aerobic exercise at least 5 days/week (for weight loss, you may need as much as 60-90 minutes). This can be any activity that gets your heart rate up. This can be divided in 10-15 minute intervals if needed, but try and build up your endurance at least once a week.  Weight bearing exercise is also recommended twice weekly.  Eat a healthy diet with lots of vegetables, fruits and fiber.  "Colorful" foods have a lot of vitamins (ie green vegetables, tomatoes, red peppers, etc).  Limit sweet tea, regular sodas and alcoholic beverages, all of which has a lot of calories and sugar.  Up to 2 alcoholic drinks daily may be beneficial for men (unless trying to lose weight, watch sugars).  Drink a lot of water.  Sunscreen of at least SPF 30 should be used on all sun-exposed parts of the skin when outside between the hours of 10 am and 4 pm (not just when at beach or pool, but even with exercise, golf, tennis, and yard work!)  Use a sunscreen that says "broad spectrum" so it covers both UVA and UVB rays, and make sure to reapply every 1-2 hours.  Remember to change the batteries in your smoke detectors when changing your clock times in the spring and fall.  Use your seat belt every time you are in a car, and please drive safely and not be distracted with cell phones and texting while driving.  Call [facility] to schedule your colonoscopy (if due--check with them).  Restart regular exercise, avoid sweets.

## 2013-08-19 ENCOUNTER — Other Ambulatory Visit: Payer: Medicare Other

## 2013-08-21 ENCOUNTER — Encounter: Payer: Self-pay | Admitting: Family Medicine

## 2013-08-27 ENCOUNTER — Telehealth: Payer: Self-pay | Admitting: *Deleted

## 2013-08-27 ENCOUNTER — Telehealth: Payer: Self-pay | Admitting: Family Medicine

## 2013-08-27 NOTE — Telephone Encounter (Signed)
Patient informed. 

## 2013-08-27 NOTE — Telephone Encounter (Signed)
Advise pt--I did not send e-mail.  He may have received this because these are listed as overdue health maintenance activities in the system.  We discussed this in detail at his visit, and these things are true.  He had stated that he looked into zostavax, but would cost him $187, and he declines.  Last colonoscopy was in 5/08, and pt stated that he had received notification from [facility] that he was due.  He can call them to schedule his follow-up.

## 2013-08-27 NOTE — Telephone Encounter (Signed)
Please call  Patient states he received email from [place] stating that he needs colonoscopy and shingles vaccine

## 2013-09-10 ENCOUNTER — Other Ambulatory Visit: Payer: Self-pay | Admitting: *Deleted

## 2013-09-10 ENCOUNTER — Telehealth: Payer: Self-pay | Admitting: Internal Medicine

## 2013-09-10 DIAGNOSIS — I10 Essential (primary) hypertension: Secondary | ICD-10-CM

## 2013-09-10 MED ORDER — ATENOLOL-CHLORTHALIDONE 50-25 MG PO TABS: 1.0000 | ORAL_TABLET | Freq: Every day | ORAL | Status: DC

## 2013-09-10 MED ORDER — AMLODIPINE BESYLATE 10 MG PO TABS: 10.0000 mg | ORAL_TABLET | Freq: Every day | ORAL | Status: DC

## 2013-09-10 NOTE — Telephone Encounter (Signed)
Done

## 2013-09-10 NOTE — Telephone Encounter (Signed)
Pt needs a refill on amlodiopine and atenolol to [facility]

## 2013-10-24 ENCOUNTER — Telehealth: Payer: Self-pay | Admitting: Internal Medicine

## 2013-10-24 DIAGNOSIS — F411 Generalized anxiety disorder: Secondary | ICD-10-CM

## 2013-10-24 DIAGNOSIS — F3289 Other specified depressive episodes: Secondary | ICD-10-CM

## 2013-10-24 DIAGNOSIS — F329 Major depressive disorder, single episode, unspecified: Secondary | ICD-10-CM

## 2013-10-24 NOTE — Telephone Encounter (Signed)
Refill request for paxil 40mg  #90 to [facility]

## 2013-10-27 MED ORDER — PAROXETINE HCL 40 MG PO TABS: 40.0000 mg | ORAL_TABLET | ORAL | Status: DC

## 2013-10-27 NOTE — Telephone Encounter (Signed)
Done

## 2013-11-03 ENCOUNTER — Telehealth: Payer: Self-pay | Admitting: Internal Medicine

## 2013-11-03 NOTE — Telephone Encounter (Signed)
Patient is scheduled for med check Wed 3/25 I will hold until his visit-gave to Juliann Pulse to place on his chart for visit.

## 2013-11-03 NOTE — Telephone Encounter (Signed)
Refill request for paroxetine, amlodipine, atenolol-chlorthalidone, klor-con M20 to [facility]

## 2013-11-05 ENCOUNTER — Ambulatory Visit
Admission: RE | Admit: 2013-11-05 | Discharge: 2013-11-05 | Disposition: A | Discharge status: Home / Self Care | Payer: Medicare Other | Source: Ambulatory Visit | Attending: Family Medicine | Admitting: Family Medicine

## 2013-11-05 ENCOUNTER — Encounter: Payer: Self-pay | Admitting: Family Medicine

## 2013-11-05 ENCOUNTER — Ambulatory Visit (INDEPENDENT_AMBULATORY_CARE_PROVIDER_SITE_OTHER): Payer: Medicare Other | Admitting: Family Medicine

## 2013-11-05 VITALS — BP 110/62 | HR 60 | Ht 65.0 in | Wt 249.0 lb

## 2013-11-05 DIAGNOSIS — F1021 Alcohol dependence, in remission: Secondary | ICD-10-CM

## 2013-11-05 DIAGNOSIS — M25511 Pain in right shoulder: Secondary | ICD-10-CM

## 2013-11-05 DIAGNOSIS — M5010 Cervical disc disorder with radiculopathy, unspecified cervical region: Secondary | ICD-10-CM

## 2013-11-05 DIAGNOSIS — M5020 Other cervical disc displacement, unspecified cervical region: Secondary | ICD-10-CM | POA: Diagnosis not present

## 2013-11-05 DIAGNOSIS — G4733 Obstructive sleep apnea (adult) (pediatric): Secondary | ICD-10-CM

## 2013-11-05 DIAGNOSIS — M5412 Radiculopathy, cervical region: Secondary | ICD-10-CM

## 2013-11-05 DIAGNOSIS — F3289 Other specified depressive episodes: Secondary | ICD-10-CM

## 2013-11-05 DIAGNOSIS — I10 Essential (primary) hypertension: Secondary | ICD-10-CM | POA: Diagnosis not present

## 2013-11-05 DIAGNOSIS — E876 Hypokalemia: Secondary | ICD-10-CM

## 2013-11-05 DIAGNOSIS — M25519 Pain in unspecified shoulder: Secondary | ICD-10-CM

## 2013-11-05 DIAGNOSIS — IMO0002 Reserved for concepts with insufficient information to code with codable children: Secondary | ICD-10-CM

## 2013-11-05 DIAGNOSIS — E291 Testicular hypofunction: Secondary | ICD-10-CM

## 2013-11-05 DIAGNOSIS — F325 Major depressive disorder, single episode, in full remission: Secondary | ICD-10-CM

## 2013-11-05 DIAGNOSIS — E669 Obesity, unspecified: Secondary | ICD-10-CM

## 2013-11-05 DIAGNOSIS — R7301 Impaired fasting glucose: Secondary | ICD-10-CM | POA: Diagnosis not present

## 2013-11-05 DIAGNOSIS — F329 Major depressive disorder, single episode, unspecified: Secondary | ICD-10-CM

## 2013-11-05 DIAGNOSIS — M7530 Calcific tendinitis of unspecified shoulder: Secondary | ICD-10-CM | POA: Diagnosis not present

## 2013-11-05 DIAGNOSIS — F411 Generalized anxiety disorder: Secondary | ICD-10-CM

## 2013-11-05 LAB — BASIC METABOLIC PANEL
BUN: 18 mg/dL (ref 6–23)
CHLORIDE: 102 meq/L (ref 96–112)
CO2: 28 meq/L (ref 19–32)
Calcium: 9 mg/dL (ref 8.4–10.5)
Creat: 0.75 mg/dL (ref 0.50–1.35)
GLUCOSE: 130 mg/dL — AB (ref 70–99)
POTASSIUM: 3.5 meq/L (ref 3.5–5.3)
SODIUM: 140 meq/L (ref 135–145)

## 2013-11-05 LAB — GLUCOSE, POCT (MANUAL RESULT ENTRY): POC GLUCOSE: 147 mg/dL — AB (ref 70–99)

## 2013-11-05 LAB — POCT GLYCOSYLATED HEMOGLOBIN (HGB A1C): Hemoglobin A1C: 5.9

## 2013-11-05 MED ORDER — PAROXETINE HCL 40 MG PO TABS: 40.0000 mg | ORAL_TABLET | ORAL | Status: DC

## 2013-11-05 MED ORDER — ATENOLOL-CHLORTHALIDONE 50-25 MG PO TABS: 1.0000 | ORAL_TABLET | Freq: Every day | ORAL | Status: DC

## 2013-11-05 MED ORDER — POTASSIUM CHLORIDE CRYS ER 20 MEQ PO TBCR: EXTENDED_RELEASE_TABLET | ORAL | Status: DC

## 2013-11-05 MED ORDER — AMLODIPINE BESYLATE 10 MG PO TABS: 10.0000 mg | ORAL_TABLET | Freq: Every day | ORAL | Status: DC

## 2013-11-05 NOTE — Progress Notes (Signed)
Chief Complaint  Patient presents with  . Hypertension    fasting med check.    He is feeling fatigue, having a hard time wanting to get up and do anything.  He denies feeling unhappy or depressed.  He has been sleeping 9-10 hours each night.  He doesn't feel refreshed in the morning.  He has known sleep apnea, and has an oral appliance.  He had sleep study while wearing appliance, and per pt, the dentist said it as working well.  However, he admits that he doesn't always wear the appliance, hasn't worn it at all this week.  He is asking for referral for right shoulder pain.  He has had problems for 18 years. He has had 2 neck surgeries, thinking that would help with his pain, however he has ongoing pain.  Pain is in the shoulder, upper chest, sometimes affects his throat, feels nauseated, and sometimes pain goes up the side of his head.  Pain radiates down to the upper arm.  He has chronic tingling all the way down to his fingers, unchanged.  Dr. Ronnald Ramp did the last surgery, and in follow-up was told that everything looked good, nothing else to be done.  Pain feels better when he puts his right hand on his head.  Meditation at night also helps some. Last CT myelogram was 06/2012 (after last surgery) IMPRESSION:  1. Uncomplicated W1-X9 solid fusion.  2. C5-C6 anterior and posterior fusion. Residual/recurrent  bilateral uncovertebral spurring and bilateral foraminal  encroachment. No evidence of pseudoarthrosis. Core portion of  the interbody bone graft into both vertebrae. No hardware  loosening or failure.  3. 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  4. C3-C4 right facet arthrosis and spurring producing right  foraminal stenosis that potentially affects the right C4 nerve.   Hypertension follow-up:  Blood pressures are not checked elsewhere.  Denies dizziness, headaches, chest pain (only related to when  shoulder/neck is hurting, not exertional).  Denies side effects of medications.  Impaired fasting glucose--Admits to eating too much.  Not exercising due to feeling lethargic. 3 pieces of Hershe[employer] dark chocolate nuggets every day.  Depression:  Moods are good.  He denies side effects from Paxil, has been taking it for at least 5 years.  Past Medical History  Diagnosis Date  . Complication of anesthesia     DIFFICULTY URINATING  . Hypertension   . 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 Anxiety   . Sleep apnea     (done at [facility]); central and obstructive; using device from dentist  . 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 Depression     and anxiety  . Esophageal reflux   . Urgency of urination   . Nocturia   . Impotence of organic origin     erectile dysfunction--has seen Dr. Serita Butcher  . Psoriasis   . Rosacea   . Dry eye   . Diabetes mellitus without complication     history of borderline DM/elevated fasting glucose.  . Arthritis     OA-knees, elbow  . Other and unspecified hyperlipidemia     h/o elevated cholesterol  . Elevated LFTs     (ETOH)fatty infiltration on u/s  . Diverticulosis     sigmoid(5/08)  . Neck pain     cervical radiculopathy-Dr. Sherley Bounds; persistent,  s/p surgery  . Alcohol dependence     history of abuse/dependence-s/p inpatient rehab at [facility] 08/2008  . Macular degeneration   . Microscopic hematuria     eval by Dr. Serita Butcher  . 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 Periodic limb movements of sleep     per sleep study 11/02  . Plantar fasciitis     treated by Dr. Paulla Dolly (injection)   Past Surgical History  Procedure Laterality Date  . Cervical spine surgery  2011, 2013    Dr. Sherley Bounds  . Knee surgery Left 2013  . Umbilical hernia repair      AS CHILD   . Posterior cervical fusion/foraminotomy  12/01/2011    Procedure: POSTERIOR CERVICAL FUSION/FORAMINOTOMY LEVEL 1;   Surgeon: Eustace Moore, MD;  Location: [place] [facility];  Service: Neurosurgery;  Laterality: Bilateral;  Cervical five-six posterior cervical fusion    History   Social History  . Marital Status: Single    Spouse Name: N/A    Number of Children: 3  . Years of Education: N/A   Occupational History  . retired Midwife)    Social History Main Topics  . Smoking status: Former Smoker -- 1.00 packs/day for 18 years    Types: Cigarettes    Quit date: 08/14/1990  . Smokeless tobacco: Never Used  . Alcohol Use: Yes     Comment: zero drinks in 5 years.  sober since 08/26/2008  . Drug Use: No  . Sexual Activity: Not Currently   Other Topics Concern  . Not on file   Social History Narrative   Divorced.  Lives alone, 1 cat.  Retired Conservation officer, nature.  Alcoholic--sober since 11/8544.  Active in AA, daily meetings, and he volunteers at [employer] (where he did rehab).  2 daughters and a son (1 in [place], 1 in [place], [place], 1 in [place], [place])   Outpatient Encounter Prescriptions as of 11/05/2013  Medication Sig  . amLODipine (NORVASC) 10 MG tablet Take 1 tablet (10 mg total) by mouth daily.  Marland Kitchen aspirin EC 81 MG tablet Take 81 mg by mouth daily.  Marland Kitchen atenolol-chlorthalidone (TENORETIC) 50-25 MG per tablet Take 1 tablet by mouth daily.  . Multiple Vitamins-Minerals (PRESERVISION AREDS PO) Take 2 tablets by mouth daily.  . Omega-3 Fatty Acids (FISH OIL PO) Take 1 capsule by mouth daily.  Marland Kitchen PARoxetine (PAXIL) 40 MG tablet Take 1 tablet (40 mg total) by mouth every morning.  . potassium chloride SA (K-DUR,KLOR-CON) 20 MEQ tablet Take 1 po daily  . Propylene Glycol (SYSTANE BALANCE OP) Apply 2 drops to eye daily. 1 drop in each eye once a day.  . sodium chloride (OCEAN) 0.65 % nasal spray Place 1 spray into the nose as needed for congestion.  . [DISCONTINUED] amLODipine (NORVASC) 10 MG tablet Take 1 tablet (10 mg total) by mouth daily.  . [DISCONTINUED] atenolol-chlorthalidone  (TENORETIC) 50-25 MG per tablet Take 1 tablet by mouth daily.  . [DISCONTINUED] PARoxetine (PAXIL) 40 MG tablet Take 1 tablet (40 mg total) by mouth every morning.  . [DISCONTINUED] potassium chloride SA (K-DUR,KLOR-CON) 20 MEQ tablet TAKE 2 TABLETS BY MOUTH DAILY FOR 2 DAYS, THEN TAKE 1 TABLET BY MOUTH THEREAFTER  . [DISCONTINUED] potassium chloride SA (K-DUR,KLOR-CON) 20 MEQ tablet Daily  . clobetasol cream (TEMOVATE) 2.70 % Apply 1 application topically daily.  . metroNIDAZOLE (METROCREAM) 0.75 % cream Apply 1 application topically at bedtime.   Allergies  Allergen Reactions  .  Betadine [Povidone Iodine] Rash   ROS:  Denies fevers, chills, URI symptoms, headaches, dizziness, chest pain, shortness of breath, GI complaints, bleeding, bruising, rash.  +ED, fatigue, weight gain, neck/shoulder pain and paresthesias as per HPI.  PHYSICAL EXAM: BP 110/62  Pulse 60  Ht '5\' 5"'  (1.651 m)  Wt 249 lb (112.946 kg)  BMI 41.44 kg/m2 Pleasant, obese male in no distress HEENT:  PERRL conjunctiva clear.  OP clear Neck: WHSS anteriorly and posteriorly. Mild discomfort over lower portion of posterior scar.  No muscle spasm. No lymphadenopathy, thyromegaly or mass Heart: regular rate and rhythm Lungs: clear bilaterally Back: no CVA tenderness Abdomen: obese, soft, nontender Extremities: no edema Skin: no lesions, rashes Neuro: alert and oriented. Normal strength, gait, cranial nerve intact   Lab Results  Component Value Date   HGBA1C 5.9 11/05/2013   Fasting Glucose 147  ASSESSMENT/PLAN:  Impaired fasting glucose - worsening; pt has gained weight.  refer to nutritionist.  diet reviewed.  weight loss, exercise - Plan: HgB A1c, Glucose (CBG), Amb ref to Medical Nutrition Therapy-MNT  Male hypogonadism - repeat testosterone.  risks reviewed; start replacement if persistently low.  weight loss encouraged - Plan: Testosterone  Essential hypertension, benign - controlled - Plan: Basic metabolic  panel, atenolol-chlorthalidone (TENORETIC) 50-25 MG per tablet, amLODipine (NORVASC) 10 MG tablet, Amb ref to Medical Nutrition Therapy-MNT  Cervical disc disorder with radiculopathy of cervical region - check c-spine x-ray.  trial of PT.  if not improving, may need referral to pain clinic (for injections; doesn't want narcotics) - Plan: DG Cervical Spine Complete  Right shoulder pain - check films--?if has shoulder arthritis changes, or if all related to radiculopathy - Plan: DG Shoulder Right  Depressive disorder, not elsewhere classified - well controlled - Plan: PARoxetine (PAXIL) 40 MG tablet  Anxiety state, unspecified - controlled - Plan: PARoxetine (PAXIL) 40 MG tablet  Hypokalemia - Plan: potassium chloride SA (K-DUR,KLOR-CON) 20 MEQ tablet  Depression, major, in remission  Alcoholism in remission - doing well.  continue with regular AA meetings  Obesity (BMI 30-39.9) - further weight gain--central obesity. risks reviewed - Plan: Amb ref to Medical Nutrition Therapy-MNT  OSA (obstructive sleep apnea) - noncompliant with use of oral appliance to treat OSA.  Encouraged regular use, weight loss  Right arm/shoulder pain.  Radicular vs muscular vs shoulder joint. X-rays--c-spine and right shoulder Physical therapy (after x-rays reviewed) Consider pain management referral for non-pharmacologic treatment  IFG/borderline DM.  Doesn't meet criteria based on A1c, and only has 1 value >126; worsening  b-met and testosterone today Refer to nutritionist  Counseled extensively re: diet, exercise, need for weight loss, risks of diabetes and obesity.  F/u 3 months (fasting)

## 2013-11-05 NOTE — Patient Instructions (Addendum)
We are referring you to a dietician. Your sugars are higher than last time (still pre-diabetic). It is very important that you get at least 30 minutes of exercise daily, and that you lose weight.  Please go to [place] for x-rays of your neck and shoulder.  We likely will refer you to physical therapy once results are seen (vs to orthopedist if significant shoulder pathologist is found; I do not expect to see this though)  Continue your current medications.

## 2013-11-06 ENCOUNTER — Other Ambulatory Visit: Payer: Self-pay | Admitting: *Deleted

## 2013-11-06 DIAGNOSIS — M5010 Cervical disc disorder with radiculopathy, unspecified cervical region: Secondary | ICD-10-CM

## 2013-11-06 DIAGNOSIS — M25511 Pain in right shoulder: Secondary | ICD-10-CM

## 2013-11-06 LAB — TESTOSTERONE: Testosterone: 245 ng/dL — ABNORMAL LOW (ref 300–890)

## 2013-11-10 ENCOUNTER — Ambulatory Visit: Payer: Medicare Other | Attending: Family Medicine | Admitting: Physical Therapy

## 2013-11-10 DIAGNOSIS — M5412 Radiculopathy, cervical region: Secondary | ICD-10-CM | POA: Insufficient documentation

## 2013-11-10 DIAGNOSIS — IMO0001 Reserved for inherently not codable concepts without codable children: Secondary | ICD-10-CM | POA: Insufficient documentation

## 2013-11-10 DIAGNOSIS — M25519 Pain in unspecified shoulder: Secondary | ICD-10-CM | POA: Insufficient documentation

## 2013-11-10 DIAGNOSIS — R5381 Other malaise: Secondary | ICD-10-CM | POA: Diagnosis not present

## 2013-11-10 DIAGNOSIS — M542 Cervicalgia: Secondary | ICD-10-CM | POA: Insufficient documentation

## 2013-11-13 ENCOUNTER — Ambulatory Visit: Payer: Medicare Other | Attending: Family Medicine | Admitting: Physical Therapy

## 2013-11-13 DIAGNOSIS — M542 Cervicalgia: Secondary | ICD-10-CM | POA: Diagnosis not present

## 2013-11-13 DIAGNOSIS — M5412 Radiculopathy, cervical region: Secondary | ICD-10-CM | POA: Diagnosis not present

## 2013-11-13 DIAGNOSIS — IMO0001 Reserved for inherently not codable concepts without codable children: Secondary | ICD-10-CM | POA: Insufficient documentation

## 2013-11-13 DIAGNOSIS — M25519 Pain in unspecified shoulder: Secondary | ICD-10-CM | POA: Diagnosis not present

## 2013-11-13 DIAGNOSIS — R5381 Other malaise: Secondary | ICD-10-CM | POA: Insufficient documentation

## 2013-11-18 ENCOUNTER — Ambulatory Visit: Payer: Medicare Other

## 2013-11-20 ENCOUNTER — Ambulatory Visit: Payer: Medicare Other

## 2013-11-25 ENCOUNTER — Ambulatory Visit: Payer: Medicare Other | Admitting: Physical Therapy

## 2013-11-27 ENCOUNTER — Telehealth: Payer: Self-pay | Admitting: *Deleted

## 2013-11-27 ENCOUNTER — Ambulatory Visit: Payer: Medicare Other

## 2013-11-27 NOTE — Telephone Encounter (Signed)
Referral faxed to Dr.Bartko's office, patient notified.

## 2013-11-27 NOTE — Telephone Encounter (Signed)
No need to complete the PT if he hasn't noticed any improvement after 3 weeks.  Dr. Ronnald Ramp did his surgery, so use the physical med/rehab doctor that is affiliated with his office (I am blanking on his name currently--we have referred to him before)

## 2013-11-27 NOTE — Telephone Encounter (Signed)
Patient called and states that he has been doing the PT for 3 weeks, 2 times per week. Really does not feel that it is helping at all. Your OV note from 11/05/13 said next step pain clinic referral for injections. Should he continue PT? Where to go from here? Please advise, thanks.

## 2013-12-02 ENCOUNTER — Ambulatory Visit: Payer: Medicare Other

## 2013-12-04 ENCOUNTER — Ambulatory Visit: Payer: Medicare Other

## 2013-12-09 ENCOUNTER — Ambulatory Visit: Payer: Medicare Other

## 2013-12-09 DIAGNOSIS — M5412 Radiculopathy, cervical region: Secondary | ICD-10-CM | POA: Diagnosis not present

## 2013-12-09 DIAGNOSIS — M67919 Unspecified disorder of synovium and tendon, unspecified shoulder: Secondary | ICD-10-CM | POA: Diagnosis not present

## 2013-12-09 DIAGNOSIS — IMO0001 Reserved for inherently not codable concepts without codable children: Secondary | ICD-10-CM | POA: Diagnosis not present

## 2013-12-09 DIAGNOSIS — M4802 Spinal stenosis, cervical region: Secondary | ICD-10-CM | POA: Diagnosis not present

## 2013-12-10 DIAGNOSIS — M25519 Pain in unspecified shoulder: Secondary | ICD-10-CM | POA: Diagnosis not present

## 2013-12-11 ENCOUNTER — Ambulatory Visit: Payer: Medicare Other

## 2013-12-16 ENCOUNTER — Ambulatory Visit: Payer: Medicare Other | Attending: Family Medicine

## 2013-12-16 DIAGNOSIS — M25519 Pain in unspecified shoulder: Secondary | ICD-10-CM | POA: Diagnosis not present

## 2013-12-16 DIAGNOSIS — M542 Cervicalgia: Secondary | ICD-10-CM | POA: Insufficient documentation

## 2013-12-16 DIAGNOSIS — M4802 Spinal stenosis, cervical region: Secondary | ICD-10-CM | POA: Diagnosis not present

## 2013-12-16 DIAGNOSIS — M5412 Radiculopathy, cervical region: Secondary | ICD-10-CM | POA: Diagnosis not present

## 2013-12-16 DIAGNOSIS — IMO0001 Reserved for inherently not codable concepts without codable children: Secondary | ICD-10-CM | POA: Diagnosis not present

## 2013-12-16 DIAGNOSIS — R5381 Other malaise: Secondary | ICD-10-CM | POA: Insufficient documentation

## 2013-12-18 ENCOUNTER — Ambulatory Visit: Payer: Medicare Other

## 2013-12-20 DIAGNOSIS — H5200 Hypermetropia, unspecified eye: Secondary | ICD-10-CM | POA: Diagnosis not present

## 2013-12-20 DIAGNOSIS — H353190 Nonexudative age-related macular degeneration, unspecified eye, stage unspecified: Secondary | ICD-10-CM | POA: Diagnosis not present

## 2013-12-20 DIAGNOSIS — H524 Presbyopia: Secondary | ICD-10-CM | POA: Diagnosis not present

## 2013-12-20 DIAGNOSIS — H52229 Regular astigmatism, unspecified eye: Secondary | ICD-10-CM | POA: Diagnosis not present

## 2014-02-05 ENCOUNTER — Ambulatory Visit (INDEPENDENT_AMBULATORY_CARE_PROVIDER_SITE_OTHER): Payer: Medicare Other | Admitting: Family Medicine

## 2014-02-05 ENCOUNTER — Encounter: Payer: Self-pay | Admitting: Family Medicine

## 2014-02-05 VITALS — BP 112/68 | HR 60 | Ht 65.0 in | Wt 221.0 lb

## 2014-02-05 DIAGNOSIS — IMO0002 Reserved for concepts with insufficient information to code with codable children: Secondary | ICD-10-CM | POA: Diagnosis not present

## 2014-02-05 DIAGNOSIS — I10 Essential (primary) hypertension: Secondary | ICD-10-CM

## 2014-02-05 DIAGNOSIS — R7301 Impaired fasting glucose: Secondary | ICD-10-CM

## 2014-02-05 DIAGNOSIS — E669 Obesity, unspecified: Secondary | ICD-10-CM

## 2014-02-05 DIAGNOSIS — Z125 Encounter for screening for malignant neoplasm of prostate: Secondary | ICD-10-CM

## 2014-02-05 DIAGNOSIS — G4733 Obstructive sleep apnea (adult) (pediatric): Secondary | ICD-10-CM

## 2014-02-05 DIAGNOSIS — F325 Major depressive disorder, single episode, in full remission: Secondary | ICD-10-CM

## 2014-02-05 DIAGNOSIS — Z79899 Other long term (current) drug therapy: Secondary | ICD-10-CM

## 2014-02-05 DIAGNOSIS — E291 Testicular hypofunction: Secondary | ICD-10-CM

## 2014-02-05 DIAGNOSIS — F1021 Alcohol dependence, in remission: Secondary | ICD-10-CM

## 2014-02-05 NOTE — Patient Instructions (Signed)
Call Dr. Brien Few to follow up on your neck/arm pain  Keep up the good work--restart your daily exercise.  Keep losing weight and eating well.  Continue your current medications.  Let us know if you develop any dizziness that is associated with low blood pressures.   Use heat, stretches and massage to your muscles in the low back.  Trying to strengthen your core (including abdominal muscles) will help with your back also (consider pilates, yoga)  Return in 3 months, with fasting bloodwork prior to your next visit.

## 2014-02-05 NOTE — Progress Notes (Signed)
Chief Complaint  Patient presents with  . Hypertension    nonfasting med check.   Patient presents to follow up on his weight and fatigue.  He reports that his energy has improved significantly since the last visit (where he complained of having a hard time wanting to get up and do anything).  He has improved his diet, has been exercising, and has lost 28 pounds.  Hypogonadism/low testosterone.  He never was prescribed testosterone replacement.  Has ED, but fatigue has improved.  Planned to recheck in September with other labs. He isn't in a sexual relationship currently to be bothered by the ED.  Sleep apnea--he has an oral appliance but he admits that he hasn't been wearing it much at all.  Feeling more refreshed than in the past when he wakes up.  He saw Dr. Brien Few for his neck and shoulder pain.  He reports getting an injection, which gave temporary relief, just for 2-3 weeks.  He recalls getting injections only one other time, between surgeries.  He reports that his pain is back to where it was before, probably worse.  "I am miserable".  Hypertension follow-up: Blood pressures are periodically checked at home, running 106-120's/60-70. Denies dizziness, headaches, chest pain. Denies side effects of medications.  Impaired fasting glucose--He has lost a lot of weight.  He cut out the chocolate, sweets, ice cream.  He has a fruit salad daily, and eating the fruit prevents him from craving the sweets. He went to [place] since his last visit, where he walked a lot.  He has been back x 10 days, and hasn't restarted [employer] yet. He had been using it 45 minutes every day (prior to leaving for trip).   Past Medical History  Diagnosis Date  . Complication of anesthesia     DIFFICULTY URINATING  . Hypertension   . 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 Anxiety   . Sleep apnea     (done at [facility]); central and obstructive; using device from dentist  . 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 Depression     and anxiety  . Esophageal reflux   . Urgency of urination   . Nocturia   . Impotence of organic origin     erectile dysfunction--has seen Dr. Serita Butcher  . Psoriasis   . Rosacea   . Dry eye   . Diabetes mellitus without complication     history of borderline DM/elevated fasting glucose.  . Arthritis     OA-knees, elbow  . Other and unspecified hyperlipidemia     h/o elevated cholesterol  . Elevated LFTs     (ETOH)fatty infiltration on u/s  . Diverticulosis     sigmoid(5/08)  . Neck pain     cervical radiculopathy-Dr. Sherley Bounds; persistent, s/p surgery  . Alcohol dependence     history of abuse/dependence-s/p inpatient rehab at [facility] 08/2008  . Macular degeneration   . Microscopic hematuria     eval by Dr. Serita Butcher  . 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 Periodic limb movements of sleep     per sleep study 11/02  . Plantar fasciitis     treated by Dr. Paulla Dolly (injection)   Past Surgical History  Procedure Laterality Date  . Cervical spine surgery  2011, 2013    Dr. Sherley Bounds  . Knee surgery Left  [id number]  . Umbilical hernia repair      AS CHILD   . Posterior cervical fusion/foraminotomy  12/01/2011    Procedure: POSTERIOR CERVICAL FUSION/FORAMINOTOMY LEVEL 1;  Surgeon: Eustace Moore, MD;  Location: [place] [facility];  Service: Neurosurgery;  Laterality: Bilateral;  Cervical five-six posterior cervical fusion    History   Social History  . Marital Status: Single    Spouse Name: N/A    Number of Children: 3  . Years of Education: N/A   Occupational History  . retired Midwife)    Social History Main Topics  . Smoking status: Former Smoker -- 1.00 packs/day for 18 years    Types: Cigarettes    Quit date: 08/14/1990  . Smokeless tobacco: Never Used  . Alcohol Use: Yes     Comment: zero drinks in 5 years.  sober since 08/26/2008  . Drug Use: No  . Sexual Activity: Not  Currently   Other Topics Concern  . Not on file   Social History Narrative   Divorced.  Lives alone, 1 cat.  Retired Conservation officer, nature.  Alcoholic--sober since 11/4008.  Active in AA, daily meetings, and he volunteers at [employer] (where he did rehab).  2 daughters and a son (1 in [place], 1 in [place], [place], 1 in [place], [place])   Outpatient Encounter Prescriptions as of 02/05/2014  Medication Sig  . amLODipine (NORVASC) 10 MG tablet Take 1 tablet (10 mg total) by mouth daily.  Marland Kitchen aspirin EC 81 MG tablet Take 81 mg by mouth daily.  Marland Kitchen atenolol-chlorthalidone (TENORETIC) 50-25 MG per tablet Take 1 tablet by mouth daily.  Marland Kitchen MAGNESIUM CHLORIDE-CALCIUM PO Take 1 tablet by mouth daily.  . Multiple Vitamins-Minerals (PRESERVISION AREDS PO) Take 2 tablets by mouth daily.  . Omega-3 Fatty Acids (FISH OIL PO) Take 1 capsule by mouth daily.  Marland Kitchen PARoxetine (PAXIL) 40 MG tablet Take 1 tablet (40 mg total) by mouth every morning.  . potassium chloride SA (K-DUR,KLOR-CON) 20 MEQ tablet Take 1 po daily  . Propylene Glycol (SYSTANE BALANCE OP) Apply 2 drops to eye daily. 1 drop in each eye once a day.  . sodium chloride (OCEAN) 0.65 % nasal spray Place 1 spray into the nose as needed for congestion.  . clobetasol cream (TEMOVATE) 2.72 % Apply 1 application topically daily.  . metroNIDAZOLE (METROCREAM) 0.75 % cream Apply 1 application topically at bedtime.   Allergies  Allergen Reactions  . Betadine [Povidone Iodine] Rash   ROS:  Denies headaches, dizziness, URI symptoms, cough, shortness of breath, chest pain, nausea, vomiting, bowel changes, urinary complaints, bleeding, bruising.  Moods have been good.   Right shoulder pain, radiating down R arm Low back pain Intentional weight loss--28#  PHYSICAL EXAM:  BP 112/68  Pulse 60  Ht 5\' 5"  (1.651 m)  Wt 221 lb (100.245 kg)  BMI 36.78 kg/m2 Well developed, pleasant obese male in no distress Neck--nontender.  Area of discomfort is R trapezius, but  not tender, no spasm. No lymphadenopathy, thyromegaly or bruit Back: Mildly tender over R paraspinous muscles in lumbar area.  No CVA tenderness Heart: regular rate and rhythm Lungs: clear bilaterally Extremities: no edema Psych: normal mood, affect, hygiene and grooming  ASSESSMENT/PLAN:  Essential hypertension, benign - controlled - Plan: Lipid panel, Comprehensive metabolic panel  Obesity (BMI 30-39.9) - congratulated on weight loss; encouraged to continue diet, exercise  Impaired fasting glucose - not fasting today.  suspect improved given dietary changes and weight loss. ok to wait  until next visit to recheck - Plan: Hemoglobin A1c  Depression, major, in remission - controlled - Plan: TSH  Alcoholism in remission  OSA (obstructive sleep apnea) - encouraged him to use oral appliance daily  Male hypogonadism - symptoms of fatigue improved with weight loss; testosterone levels might also improve with weight loss--plan to recheck in 3 mos - Plan: Testosterone  Encounter for long-term (current) use of other medications - Plan: CBC with Differential, Comprehensive metabolic panel  Special screening for malignant neoplasm of prostate - Plan: PSA, Medicare  Call Dr. Lauris Poag office to arrange for follow-up, possible second injection. Consider changing to Cymbalta for pain relief as well as keeping moods in check  F/u 3 months with fasting labs prior

## 2014-03-03 DIAGNOSIS — M4802 Spinal stenosis, cervical region: Secondary | ICD-10-CM

## 2014-03-03 DIAGNOSIS — M5412 Radiculopathy, cervical region: Secondary | ICD-10-CM | POA: Diagnosis not present

## 2014-03-03 HISTORY — DX: Spinal stenosis, cervical region: M48.02

## 2014-03-03 HISTORY — DX: Radiculopathy, cervical region: M54.12

## 2014-03-11 ENCOUNTER — Encounter: Payer: Self-pay | Admitting: Internal Medicine

## 2014-03-12 ENCOUNTER — Encounter: Payer: Self-pay | Admitting: Family Medicine

## 2014-04-24 DIAGNOSIS — M542 Cervicalgia: Secondary | ICD-10-CM | POA: Diagnosis not present

## 2014-04-24 DIAGNOSIS — M25519 Pain in unspecified shoulder: Secondary | ICD-10-CM | POA: Diagnosis not present

## 2014-04-24 DIAGNOSIS — M545 Low back pain, unspecified: Secondary | ICD-10-CM | POA: Diagnosis not present

## 2014-04-30 ENCOUNTER — Other Ambulatory Visit: Payer: Medicare Other

## 2014-04-30 DIAGNOSIS — E291 Testicular hypofunction: Secondary | ICD-10-CM

## 2014-04-30 DIAGNOSIS — R7301 Impaired fasting glucose: Secondary | ICD-10-CM

## 2014-04-30 DIAGNOSIS — Z125 Encounter for screening for malignant neoplasm of prostate: Secondary | ICD-10-CM

## 2014-04-30 DIAGNOSIS — M545 Low back pain, unspecified: Secondary | ICD-10-CM | POA: Diagnosis not present

## 2014-04-30 DIAGNOSIS — F325 Major depressive disorder, single episode, in full remission: Secondary | ICD-10-CM

## 2014-04-30 DIAGNOSIS — I10 Essential (primary) hypertension: Secondary | ICD-10-CM | POA: Diagnosis not present

## 2014-04-30 DIAGNOSIS — Z79899 Other long term (current) drug therapy: Secondary | ICD-10-CM | POA: Diagnosis not present

## 2014-04-30 DIAGNOSIS — M5137 Other intervertebral disc degeneration, lumbosacral region: Secondary | ICD-10-CM | POA: Diagnosis not present

## 2014-04-30 DIAGNOSIS — IMO0002 Reserved for concepts with insufficient information to code with codable children: Secondary | ICD-10-CM | POA: Diagnosis not present

## 2014-04-30 LAB — CBC WITH DIFFERENTIAL/PLATELET
BASOS ABS: 0 10*3/uL (ref 0.0–0.1)
BASOS PCT: 0 % (ref 0–1)
EOS ABS: 0.3 10*3/uL (ref 0.0–0.7)
EOS PCT: 4 % (ref 0–5)
HCT: 42.4 % (ref 39.0–52.0)
Hemoglobin: 15.4 g/dL (ref 13.0–17.0)
Lymphocytes Relative: 37 % (ref 12–46)
Lymphs Abs: 2.4 10*3/uL (ref 0.7–4.0)
MCH: 31.9 pg (ref 26.0–34.0)
MCHC: 36.3 g/dL — ABNORMAL HIGH (ref 30.0–36.0)
MCV: 87.8 fL (ref 78.0–100.0)
MONO ABS: 0.6 10*3/uL (ref 0.1–1.0)
Monocytes Relative: 9 % (ref 3–12)
Neutro Abs: 3.2 10*3/uL (ref 1.7–7.7)
Neutrophils Relative %: 50 % (ref 43–77)
Platelets: 195 10*3/uL (ref 150–400)
RBC: 4.83 MIL/uL (ref 4.22–5.81)
RDW: 13.9 % (ref 11.5–15.5)
WBC: 6.4 10*3/uL (ref 4.0–10.5)

## 2014-04-30 LAB — COMPREHENSIVE METABOLIC PANEL
ALBUMIN: 4.2 g/dL (ref 3.5–5.2)
ALK PHOS: 52 U/L (ref 39–117)
ALT: 20 U/L (ref 0–53)
AST: 15 U/L (ref 0–37)
BUN: 14 mg/dL (ref 6–23)
CO2: 28 mEq/L (ref 19–32)
Calcium: 9.2 mg/dL (ref 8.4–10.5)
Chloride: 102 mEq/L (ref 96–112)
Creat: 0.71 mg/dL (ref 0.50–1.35)
Glucose, Bld: 130 mg/dL — ABNORMAL HIGH (ref 70–99)
Potassium: 3.5 mEq/L (ref 3.5–5.3)
Sodium: 139 mEq/L (ref 135–145)
Total Bilirubin: 0.6 mg/dL (ref 0.2–1.2)
Total Protein: 6.6 g/dL (ref 6.0–8.3)

## 2014-04-30 LAB — LIPID PANEL
Cholesterol: 169 mg/dL (ref 0–200)
HDL: 43 mg/dL (ref >39)
LDL CALC: 114 mg/dL — AB (ref 0–99)
Total CHOL/HDL Ratio: 3.9 Ratio
Triglycerides: 62 mg/dL (ref <150)
VLDL: 12 mg/dL (ref 0–40)

## 2014-04-30 LAB — HEMOGLOBIN A1C
Hgb A1c MFr Bld: 5.7 % — ABNORMAL HIGH (ref <5.7)
Mean Plasma Glucose: 117 mg/dL — ABNORMAL HIGH (ref <117)

## 2014-05-01 LAB — PSA, MEDICARE: PSA: 2.83 ng/mL (ref <=4.00)

## 2014-05-01 LAB — TESTOSTERONE: Testosterone: 409 ng/dL (ref 300–890)

## 2014-05-01 LAB — TSH: TSH: 2.249 u[IU]/mL (ref 0.350–4.500)

## 2014-05-05 DIAGNOSIS — M5137 Other intervertebral disc degeneration, lumbosacral region: Secondary | ICD-10-CM | POA: Diagnosis not present

## 2014-05-05 DIAGNOSIS — M545 Low back pain, unspecified: Secondary | ICD-10-CM | POA: Diagnosis not present

## 2014-05-07 ENCOUNTER — Ambulatory Visit (INDEPENDENT_AMBULATORY_CARE_PROVIDER_SITE_OTHER): Payer: Medicare Other | Admitting: Family Medicine

## 2014-05-07 ENCOUNTER — Encounter: Payer: Self-pay | Admitting: Family Medicine

## 2014-05-07 VITALS — BP 128/70 | HR 60 | Ht 65.0 in | Wt 218.0 lb

## 2014-05-07 DIAGNOSIS — Z23 Encounter for immunization: Secondary | ICD-10-CM

## 2014-05-07 DIAGNOSIS — E7800 Pure hypercholesterolemia, unspecified: Secondary | ICD-10-CM

## 2014-05-07 DIAGNOSIS — E291 Testicular hypofunction: Secondary | ICD-10-CM

## 2014-05-07 DIAGNOSIS — IMO0002 Reserved for concepts with insufficient information to code with codable children: Secondary | ICD-10-CM

## 2014-05-07 DIAGNOSIS — E119 Type 2 diabetes mellitus without complications: Secondary | ICD-10-CM | POA: Insufficient documentation

## 2014-05-07 DIAGNOSIS — R7301 Impaired fasting glucose: Secondary | ICD-10-CM | POA: Diagnosis not present

## 2014-05-07 DIAGNOSIS — F325 Major depressive disorder, single episode, in full remission: Secondary | ICD-10-CM

## 2014-05-07 DIAGNOSIS — I10 Essential (primary) hypertension: Secondary | ICD-10-CM

## 2014-05-07 DIAGNOSIS — M5137 Other intervertebral disc degeneration, lumbosacral region: Secondary | ICD-10-CM | POA: Diagnosis not present

## 2014-05-07 DIAGNOSIS — F411 Generalized anxiety disorder: Secondary | ICD-10-CM | POA: Insufficient documentation

## 2014-05-07 DIAGNOSIS — M545 Low back pain, unspecified: Secondary | ICD-10-CM | POA: Diagnosis not present

## 2014-05-07 NOTE — Progress Notes (Signed)
Chief Complaint  Patient presents with  . Hypertension    nonfasting med check, labs already done. Would like rx for zostavax today to take to pharmacy. Did have an issue with his feet tingling and took vtiamin B1 and this did help.    Seeing Dr. Alfonso Ramus at [facility] for his neck and back (he had switched over there after not getting much benefit from injections from Dr. Brien Few).  He has been getting PT and plans to get a TENS unit.  Pain had been getting better, but worse after doing yardwork yesterday.  High cholesterol--eats a lot of cheese; poached eggs 2 daily (14/week).  Hypogonadism--energy has improved, no longer having fatigue.  He has never taken any testosterone replacement.  OSA:  Not using oral appliance, forgets.  Hypertension follow-up: Blood pressures are periodically checked at home, running 110-140/60-75. Denies dizziness, headaches, chest pain. Denies side effects of medications.   Impaired fasting glucose--He has lost a lot of weight. He still isn't eating chocolate, sweets, ice cream. He has a fruit salad daily, and eating the fruit prevents him from craving the sweets. He hasn't been back on the NordicTrack since he started having more back pain. He used to use it 45 minutes every day.  He admits to "not doing a darn thing" recently for exercise.  He has continued to lose a little weight.  Wt Readings from Last 3 Encounters:  05/07/14 218 lb (98.884 kg)  02/05/14 221 lb (100.245 kg)  11/05/13 249 lb (112.946 kg)   Past Medical History  Diagnosis Date  . Complication of anesthesia     DIFFICULTY URINATING  . Hypertension   . 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 Anxiety   . Sleep apnea     (done at [facility]); central and obstructive; using device from dentist  . 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 Depression     and anxiety  . Esophageal reflux   . Urgency of urination   . Nocturia   . Impotence of organic origin      erectile dysfunction--has seen Dr. Serita Butcher  . Psoriasis   . Rosacea   . Dry eye   . Diabetes mellitus without complication     history of borderline DM/elevated fasting glucose.  . Arthritis     OA-knees, elbow  . Other and unspecified hyperlipidemia     h/o elevated cholesterol  . Elevated LFTs     (ETOH)fatty infiltration on u/s  . Diverticulosis     sigmoid(5/08)  . Neck pain     cervical radiculopathy-Dr. Sherley Bounds; persistent, s/p surgery  . Alcohol dependence     history of abuse/dependence-s/p inpatient rehab at [facility] 08/2008  . Macular degeneration   . Microscopic hematuria     eval by Dr. Serita Butcher  . 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 Periodic limb movements of sleep     per sleep study 11/02  . Plantar fasciitis     treated by Dr. Paulla Dolly (injection)  . Spinal stenosis in cervical region 03/03/14  . Brachial neuritis or radiculitis NOS 03/03/2014   Past Surgical History  Procedure Laterality Date  . Cervical spine surgery  2011, 2013    Dr. Sherley Bounds  . Knee surgery Left 2013  . Umbilical hernia repair      AS CHILD   .  Posterior cervical fusion/foraminotomy  12/01/2011    Procedure: POSTERIOR CERVICAL FUSION/FORAMINOTOMY LEVEL 1;  Surgeon: Eustace Moore, MD;  Location: [place] [facility];  Service: Neurosurgery;  Laterality: Bilateral;  Cervical five-six posterior cervical fusion    History   Social History  . Marital Status: Single    Spouse Name: N/A    Number of Children: 3  . Years of Education: N/A   Occupational History  . retired Midwife)    Social History Main Topics  . Smoking status: Former Smoker -- 1.00 packs/day for 18 years    Types: Cigarettes    Quit date: 08/14/1990  . Smokeless tobacco: Never Used  . Alcohol Use: Yes     Comment: zero drinks in 5 years.  sober since 08/26/2008  . Drug Use: No  . Sexual Activity: Not Currently   Other Topics Concern  . Not on file   Social History  Narrative   Divorced.  Lives alone, 1 cat.  Retired Conservation officer, nature.  Alcoholic--sober since 11/979.  Active in AA, daily meetings, and he volunteers at [employer] (where he did rehab).  2 daughters and a son (78 in [place], 1 in [place], [place], 1 in [place], [place])   Outpatient Encounter Prescriptions as of 05/07/2014  Medication Sig Note  . amLODipine (NORVASC) 10 MG tablet Take 1 tablet (10 mg total) by mouth daily.   Marland Kitchen aspirin EC 81 MG tablet Take 81 mg by mouth daily.   Marland Kitchen atenolol-chlorthalidone (TENORETIC) 50-25 MG per tablet Take 1 tablet by mouth daily.   Marland Kitchen MAGNESIUM CHLORIDE-CALCIUM PO Take 1 tablet by mouth daily.   . Multiple Vitamins-Minerals (PRESERVISION AREDS PO) Take 2 tablets by mouth daily.   . Omega-3 Fatty Acids (FISH OIL PO) Take 1 capsule by mouth daily.   Marland Kitchen PARoxetine (PAXIL) 40 MG tablet Take 1 tablet (40 mg total) by mouth every morning.   . potassium chloride SA (K-DUR,KLOR-CON) 20 MEQ tablet Take 1 po daily   . Propylene Glycol (SYSTANE BALANCE OP) Apply 2 drops to eye daily. 1 drop in each eye once a day.   . sodium chloride (OCEAN) 0.65 % nasal spray Place 1 spray into the nose as needed for congestion.   . thiamine (VITAMIN B-1) 100 MG tablet Take 100 mg by mouth daily.   . clobetasol cream (TEMOVATE) 1.91 % Apply 1 application topically daily.   . metroNIDAZOLE (METROCREAM) 0.75 % cream Apply 1 application topically at bedtime.   . Triamcinolone Acetonide (NASACORT ALLERGY 24HR NA) Place 1 spray into the nose. 05/07/2014: Uses as needed for allergies   Allergies  Allergen Reactions  . Betadine [Povidone Iodine] Rash   ROS:  Denies fevers, chills, headaches, dizziness, chest pain, palpitations, URI symptoms, cough, shortness of breath, GI or GU complaints, bleeding, bruising, rashes, depression, anxiety, insomnia.  +back/neck pain as per HPI.  Had some recent tingling that resolved after taking B1.  No weakness  PHYSICAL EXAM: BP 128/70  Pulse 60  Ht 5\' 5"   (1.651 m)  Wt 218 lb (98.884 kg)  BMI 36.28 kg/m2 Well developed, talkative, pleasant male in no distress HEENT: PERRL, EOMI, conjunctiva clear, OP clear Neck: no lymphadenopathy, thyromegaly or carotid bruit Heart: regular rate and rhythm, no murmur Lungs: clear bilaterally Back: Muscle spasm on L paraspinous muscle, tender to palpation (at level of flank; no CVA tenderness.).  No spinal tenderness Abdomen: obese, soft, nontender, no organomegaly or mass Extremities: no edema, 2+ pulse. Normal monofilament sensation Psych: normal mood,  affect, hygiene and grooming Neuro: alert and oriented. Cranial nerves intact. Normal strength, gait  Lab Results  Component Value Date   HGBA1C 5.7* 04/30/2014   Lab Results  Component Value Date   WBC 6.4 04/30/2014   HGB 15.4 04/30/2014   HCT 42.4 04/30/2014   MCV 87.8 04/30/2014   PLT 195 04/30/2014   Lab Results  Component Value Date   CHOL 169 04/30/2014   HDL 43 04/30/2014   LDLCALC 114* 04/30/2014   TRIG 62 04/30/2014   CHOLHDL 3.9 04/30/2014   Lab Results  Component Value Date   PSA 2.83 04/30/2014   PSA 2.68 03/18/2013   Lab Results  Component Value Date   TSH 2.249 04/30/2014     Chemistry      Component Value Date/Time   NA 139 04/30/2014 0845   K 3.5 04/30/2014 0845   CL 102 04/30/2014 0845   CO2 28 04/30/2014 0845   BUN 14 04/30/2014 0845   CREATININE 0.71 04/30/2014 0845   CREATININE 0.56 11/27/2011 1057      Component Value Date/Time   CALCIUM 9.2 04/30/2014 0845   ALKPHOS 52 04/30/2014 0845   AST 15 04/30/2014 0845   ALT 20 04/30/2014 0845   BILITOT 0.6 04/30/2014 0845     Glucose (fasting) 130. Fasting glucose 6 months ago was also 130  Lab Results  Component Value Date   TESTOSTERONE 409 04/30/2014   (up from 245 6 months ago).  ASSESSMENT/PLAN:  Type II or unspecified type diabetes mellitus without mention of complication, not stated as uncontrolled - diagnosed based on fasting glu >130 x 2 (A1c was never 6.5).   Counseled extensively re: diet, exercise, weight loss, complications. recheck 3 mos (no meds) - Plan: Hemoglobin A1c, Glucose, random, HM Diabetes Foot Exam  Essential hypertension, benign - controlled  Impaired fasting glucose - now meets criteria for diabetes  Male hypogonadism - resolved--testosterone levels now improved (and energy is better)  Depression, major, in remission - doing well on Paxil  Anxiety state, unspecified - controlled, doing well on Paxil  Need for prophylactic vaccination against Streptococcus pneumoniae (pneumococcus) - Plan: Pneumococcal conjugate vaccine 13-valent  Pure hypercholesterolemia - given new dx of DM, goal LDL is now <100.  Significant changes can be made in diet to get LDL down without meds at this point.  counseled in detail - Plan: Lipid panel  Need for prophylactic vaccination and inoculation against influenza - Plan: Flu vaccine HIGH DOSE PF (Fluzone Tri High dose)   Meets criteria for DM based on 2 fasting sugars >126 in the last year, even though A1c is improved. Discussed diet, exercise, weight loss.  Low cholesterol diet reviewed. Cut back on egg yolks and cheese. Goal LDL<100 (has been there in past, without eds)  OSA:  Needs to wear oral appliance.  Reviewed risks of untreated OSA.  Depression/anxiety controlled with Paxil  Restart exercise to at least 30 minutes daily. Cut back on portion sizes of carbs (breads, rice, potatoes), and try and use whole grain/whole wheat/brown version of your carbs. Limit your fruits to just 2 small servings daily, and eat more vegetables. Cut back on egg yolks (try and get just 4/week; egg whites are okay); Cut back on cheese Please wear your oral appliance for sleep apnea every night.  F/u 3 months with A1c, glucose and lipids prior to visit  High dose flu shot today and Prevnar-13 Written rx for zostavax given--must wait a month before getting

## 2014-05-07 NOTE — Patient Instructions (Addendum)
Restart exercise to at least 30 minutes daily. Cut back on portion sizes of carbs (breads, rice, potatoes), and try and use whole grain/whole wheat/brown version of your carbs. Limit your fruits to just 2 small servings daily, and eat more vegetables. Cut back on egg yolks (try and get just 4/week; egg whites are okay); Cut back on cheese Please wear your oral appliance for sleep apnea every night.  You must wait a month before getting the shingles vaccine.  It is important for you to let your eye doctor know that you have diabetes, and get yearly diabetic eye exams.  Have the eye doctor fax over your diabetic eye exam when you have it done.

## 2014-05-12 DIAGNOSIS — M545 Low back pain, unspecified: Secondary | ICD-10-CM | POA: Diagnosis not present

## 2014-05-14 DIAGNOSIS — I232 Ventricular septal defect as current complication following acute myocardial infarction: Secondary | ICD-10-CM | POA: Diagnosis not present

## 2014-05-14 DIAGNOSIS — M5032 Other cervical disc degeneration, mid-cervical region: Secondary | ICD-10-CM | POA: Diagnosis not present

## 2014-05-14 DIAGNOSIS — M5441 Lumbago with sciatica, right side: Secondary | ICD-10-CM | POA: Diagnosis not present

## 2014-05-14 DIAGNOSIS — E663 Overweight: Secondary | ICD-10-CM | POA: Diagnosis not present

## 2014-05-26 ENCOUNTER — Other Ambulatory Visit: Payer: Self-pay | Admitting: Family Medicine

## 2014-05-28 DIAGNOSIS — H524 Presbyopia: Secondary | ICD-10-CM | POA: Diagnosis not present

## 2014-05-28 DIAGNOSIS — H5203 Hypermetropia, bilateral: Secondary | ICD-10-CM | POA: Diagnosis not present

## 2014-05-28 DIAGNOSIS — H2513 Age-related nuclear cataract, bilateral: Secondary | ICD-10-CM | POA: Diagnosis not present

## 2014-05-28 DIAGNOSIS — E119 Type 2 diabetes mellitus without complications: Secondary | ICD-10-CM | POA: Diagnosis not present

## 2014-05-28 DIAGNOSIS — H52223 Regular astigmatism, bilateral: Secondary | ICD-10-CM | POA: Diagnosis not present

## 2014-05-28 DIAGNOSIS — H3531 Nonexudative age-related macular degeneration: Secondary | ICD-10-CM | POA: Diagnosis not present

## 2014-06-04 DIAGNOSIS — M5441 Lumbago with sciatica, right side: Secondary | ICD-10-CM | POA: Diagnosis not present

## 2014-06-04 DIAGNOSIS — E663 Overweight: Secondary | ICD-10-CM | POA: Diagnosis not present

## 2014-06-04 DIAGNOSIS — M5032 Other cervical disc degeneration, mid-cervical region: Secondary | ICD-10-CM | POA: Diagnosis not present

## 2014-06-04 DIAGNOSIS — I272 Other secondary pulmonary hypertension: Secondary | ICD-10-CM | POA: Diagnosis not present

## 2014-06-09 DIAGNOSIS — E663 Overweight: Secondary | ICD-10-CM | POA: Diagnosis not present

## 2014-06-09 DIAGNOSIS — M5032 Other cervical disc degeneration, mid-cervical region: Secondary | ICD-10-CM | POA: Diagnosis not present

## 2014-06-09 DIAGNOSIS — I272 Other secondary pulmonary hypertension: Secondary | ICD-10-CM | POA: Diagnosis not present

## 2014-06-09 DIAGNOSIS — M5441 Lumbago with sciatica, right side: Secondary | ICD-10-CM | POA: Diagnosis not present

## 2014-06-12 DIAGNOSIS — M5032 Other cervical disc degeneration, mid-cervical region: Secondary | ICD-10-CM | POA: Diagnosis not present

## 2014-06-12 DIAGNOSIS — E663 Overweight: Secondary | ICD-10-CM | POA: Diagnosis not present

## 2014-06-12 DIAGNOSIS — I272 Other secondary pulmonary hypertension: Secondary | ICD-10-CM | POA: Diagnosis not present

## 2014-06-12 DIAGNOSIS — M5441 Lumbago with sciatica, right side: Secondary | ICD-10-CM | POA: Diagnosis not present

## 2014-06-16 DIAGNOSIS — I272 Other secondary pulmonary hypertension: Secondary | ICD-10-CM | POA: Diagnosis not present

## 2014-06-16 DIAGNOSIS — M5032 Other cervical disc degeneration, mid-cervical region: Secondary | ICD-10-CM | POA: Diagnosis not present

## 2014-06-16 DIAGNOSIS — M5441 Lumbago with sciatica, right side: Secondary | ICD-10-CM | POA: Diagnosis not present

## 2014-06-16 DIAGNOSIS — E663 Overweight: Secondary | ICD-10-CM | POA: Diagnosis not present

## 2014-06-19 DIAGNOSIS — M5441 Lumbago with sciatica, right side: Secondary | ICD-10-CM | POA: Diagnosis not present

## 2014-06-19 DIAGNOSIS — M5032 Other cervical disc degeneration, mid-cervical region: Secondary | ICD-10-CM | POA: Diagnosis not present

## 2014-06-19 DIAGNOSIS — E663 Overweight: Secondary | ICD-10-CM | POA: Diagnosis not present

## 2014-06-19 DIAGNOSIS — I272 Other secondary pulmonary hypertension: Secondary | ICD-10-CM | POA: Diagnosis not present

## 2014-08-03 ENCOUNTER — Other Ambulatory Visit: Payer: Medicare Other

## 2014-08-03 DIAGNOSIS — E119 Type 2 diabetes mellitus without complications: Secondary | ICD-10-CM

## 2014-08-03 DIAGNOSIS — E7800 Pure hypercholesterolemia, unspecified: Secondary | ICD-10-CM

## 2014-08-03 LAB — LIPID PANEL
Cholesterol: 172 mg/dL (ref 0–200)
HDL: 44 mg/dL (ref >39)
LDL CALC: 109 mg/dL — AB (ref 0–99)
TRIGLYCERIDES: 95 mg/dL (ref <150)
Total CHOL/HDL Ratio: 3.9 Ratio
VLDL: 19 mg/dL (ref 0–40)

## 2014-08-03 LAB — GLUCOSE, RANDOM: GLUCOSE: 128 mg/dL — AB (ref 70–99)

## 2014-08-04 LAB — HEMOGLOBIN A1C
Hgb A1c MFr Bld: 5.5 % (ref <5.7)
MEAN PLASMA GLUCOSE: 111 mg/dL (ref <117)

## 2014-08-06 ENCOUNTER — Encounter: Payer: Self-pay | Admitting: Family Medicine

## 2014-08-06 ENCOUNTER — Ambulatory Visit (INDEPENDENT_AMBULATORY_CARE_PROVIDER_SITE_OTHER): Payer: Medicare Other | Admitting: Family Medicine

## 2014-08-06 VITALS — BP 124/74 | HR 52 | Ht 64.5 in | Wt 218.0 lb

## 2014-08-06 DIAGNOSIS — Z5181 Encounter for therapeutic drug level monitoring: Secondary | ICD-10-CM

## 2014-08-06 DIAGNOSIS — I10 Essential (primary) hypertension: Secondary | ICD-10-CM

## 2014-08-06 DIAGNOSIS — E785 Hyperlipidemia, unspecified: Secondary | ICD-10-CM | POA: Diagnosis not present

## 2014-08-06 DIAGNOSIS — E119 Type 2 diabetes mellitus without complications: Secondary | ICD-10-CM

## 2014-08-06 NOTE — Patient Instructions (Signed)
Please make sure to use your oral appliance EVERY NIGHT As discussed, you need to cut back on egg (yolk) intake.  Try cutting back to just 1 egg daily, versus changing your breakfast choice, and further limiting eggs to just 2-3/week.  Exercise at least 30 minutes daily--try and use your phone alarm to help remind you.  Continue your current medications.

## 2014-08-06 NOTE — Progress Notes (Signed)
Chief Complaint  Patient presents with  . med check    med check. shoulder pain coming back   Hypertension follow-up:  Blood pressures elsewhere are as low as 105/65, up to 120/70, never >140.  Denies dizziness, headaches, chest pain.  Denies side effects of medications.  Hyperlipidemia:  He continues to eat 2 poached eggs daily, but did cut back some on the cheese.  OSA:  He continues to NOT use the oral appliance.  Diabetes (met by criteria of having 2 fasting sugars >126).  His weight has remained stable since last visit.  Exercise is intermittent (just yardwork and walking around the house).  Hasn't been walking regularly outside or using NordicTrack.  PMH, PSH, and SH were reviewed/updated  Outpatient Encounter Prescriptions as of 08/06/2014  Medication Sig Note  . amLODipine (NORVASC) 10 MG tablet TAKE 1 TABLET EVERY DAY   . aspirin EC 81 MG tablet Take 81 mg by mouth daily.   Marland Kitchen atenolol-chlorthalidone (TENORETIC) 50-25 MG per tablet TAKE 1 TABLET EVERY DAY   . clobetasol cream (TEMOVATE) 7.71 % Apply 1 application topically daily.   Marland Kitchen MAGNESIUM CHLORIDE-CALCIUM PO Take 1 tablet by mouth daily.   . Multiple Vitamins-Minerals (PRESERVISION AREDS PO) Take 2 tablets by mouth daily.   . Omega-3 Fatty Acids (FISH OIL PO) Take 1 capsule by mouth daily.   Marland Kitchen PARoxetine (PAXIL) 40 MG tablet TAKE 1 TABLET EVERY MORNING   . potassium chloride SA (K-DUR,KLOR-CON) 20 MEQ tablet TAKE 1 TABLET EVERY DAY   . Propylene Glycol (SYSTANE BALANCE OP) Apply 2 drops to eye daily. 1 drop in each eye once a day.   . sodium chloride (OCEAN) 0.65 % nasal spray Place 1 spray into the nose as needed for congestion.   . thiamine (VITAMIN B-1) 100 MG tablet Take 100 mg by mouth daily.   . metroNIDAZOLE (METROCREAM) 0.75 % cream Apply 1 application topically at bedtime. 08/06/2014: Uses prn with flares  . Triamcinolone Acetonide (NASACORT ALLERGY 24HR NA) Place 1 spray into the nose. 05/07/2014: Uses as needed  for allergies  started using Aleve twice daily which seems to be helping his shoulder at this time  Allergies  Allergen Reactions  . Betadine [Povidone Iodine] Rash   ROS:  No fevers, chills, decreased appetite, nausea, vomiting, bowel changes, urinary complaints, URI symptoms, cough, shortness of breath, chest pain, edema, bleeding, bruising, rashes, depression.  He has been having some recurrent pain in his shoulder, feels better when arm is above his head, consistent with cervical radiculopathy, similar to in past. He states injections only helped for a few days.  Currently using aleve twice daily with good results.  PHYSICAL EXAM: BP 124/74 mmHg  Pulse 52  Ht 5' 4.5" (1.638 m)  Wt 218 lb (98.884 kg)  BMI 36.86 kg/m2 Well developed, pleasant obese male in no distress HEENT:  PERRL, conjunctiva clear. Neck: no lymphadenopathy, thyromegaly or mass Heart: regular rate and rhythm Lungs: clear bilaterally Abdomen: soft, nontender Extremities: no edema Psych: normal mood, affect, hygiene and grooming Neuro: grossly normal cranial nerves and gait   Lab Results  Component Value Date   HGBA1C 5.5 08/03/2014   Fasting glucose 128  Lab Results  Component Value Date   CHOL 172 08/03/2014   HDL 44 08/03/2014   LDLCALC 109* 08/03/2014   TRIG 95 08/03/2014   CHOLHDL 3.9 08/03/2014   ASSESSMENT/PLAN:   Prediabetes/Impaired Fasting Glucose--A1c is normal.  Discussed need for daily exercise and weight loss, as well as  proper diet.  LDL goal is <100. Previously took statins and had muscle aches This last 3 months was supposed to be a dietary trial to get LDL<100, but he didn't really make any significant changes in his diet, despite the counseling given at last visit.  Since he didn't tolerate statins, and his diet still has a lot of room for improvement, will give him (as requested) another 3 months to change diet and try and get LDL below 100.  Diet discussed extensively (cutting back  to just 1 egg, vs changing breakfast entirely).  Also briefly mentioned nonstatin drugs (ie Welchol, Zetia).  OSA--Noncompliant.  Strongly encouraged regular use of oral appliance.  Risks of untreated sleep apnea reviwed  Shoulder pain--will call for rx NSAID if OTC no longer controls (cervical radiculopathy)  A1c, c-met, lipids prior to next visit (c-met due to potential for chronic NSAID use)  30 min visit, more than 1/2 spent counseling (although much of it was repeating what was said last visit, but not adhered to)

## 2014-09-01 ENCOUNTER — Other Ambulatory Visit: Payer: Self-pay | Admitting: Family Medicine

## 2014-10-23 ENCOUNTER — Telehealth: Payer: Self-pay | Admitting: Internal Medicine

## 2014-10-23 MED ORDER — PAROXETINE HCL 40 MG PO TABS: 40.0000 mg | ORAL_TABLET | Freq: Every morning | ORAL | Status: DC

## 2014-10-23 NOTE — Telephone Encounter (Signed)
Pt needs a refill for paroxetine 40mg  to [place]

## 2014-10-27 ENCOUNTER — Ambulatory Visit
Admission: RE | Admit: 2014-10-27 | Discharge: 2014-10-27 | Disposition: A | Discharge status: Home / Self Care | Payer: Medicare Other | Source: Ambulatory Visit | Attending: Family Medicine | Admitting: Family Medicine

## 2014-10-27 ENCOUNTER — Ambulatory Visit: Payer: Medicare Other

## 2014-10-27 ENCOUNTER — Other Ambulatory Visit: Payer: Self-pay | Admitting: Family Medicine

## 2014-10-27 DIAGNOSIS — Z0000 Encounter for general adult medical examination without abnormal findings: Secondary | ICD-10-CM

## 2014-10-27 DIAGNOSIS — Z136 Encounter for screening for cardiovascular disorders: Secondary | ICD-10-CM | POA: Diagnosis not present

## 2014-10-27 DIAGNOSIS — Z87891 Personal history of nicotine dependence: Secondary | ICD-10-CM | POA: Diagnosis not present

## 2014-11-02 ENCOUNTER — Other Ambulatory Visit: Payer: Medicare Other

## 2014-11-05 ENCOUNTER — Encounter: Payer: Medicare Other | Admitting: Family Medicine

## 2014-11-10 ENCOUNTER — Other Ambulatory Visit: Payer: Medicare Other

## 2014-11-10 DIAGNOSIS — Z5181 Encounter for therapeutic drug level monitoring: Secondary | ICD-10-CM | POA: Diagnosis not present

## 2014-11-10 DIAGNOSIS — I10 Essential (primary) hypertension: Secondary | ICD-10-CM | POA: Diagnosis not present

## 2014-11-10 DIAGNOSIS — E119 Type 2 diabetes mellitus without complications: Secondary | ICD-10-CM

## 2014-11-10 DIAGNOSIS — E785 Hyperlipidemia, unspecified: Secondary | ICD-10-CM | POA: Diagnosis not present

## 2014-11-10 LAB — LIPID PANEL
Cholesterol: 154 mg/dL (ref 0–200)
HDL: 34 mg/dL — ABNORMAL LOW (ref >=40)
LDL Cholesterol: 107 mg/dL — ABNORMAL HIGH (ref 0–99)
Total CHOL/HDL Ratio: 4.5 Ratio
Triglycerides: 65 mg/dL (ref <150)
VLDL: 13 mg/dL (ref 0–40)

## 2014-11-10 LAB — COMPREHENSIVE METABOLIC PANEL
ALT: 19 U/L (ref 0–53)
AST: 14 U/L (ref 0–37)
Albumin: 4.1 g/dL (ref 3.5–5.2)
Alkaline Phosphatase: 50 U/L (ref 39–117)
BUN: 19 mg/dL (ref 6–23)
CO2: 30 mEq/L (ref 19–32)
Calcium: 9.2 mg/dL (ref 8.4–10.5)
Chloride: 105 mEq/L (ref 96–112)
Creat: 0.53 mg/dL (ref 0.50–1.35)
Glucose, Bld: 139 mg/dL — ABNORMAL HIGH (ref 70–99)
Potassium: 3.4 mEq/L — ABNORMAL LOW (ref 3.5–5.3)
Sodium: 141 mEq/L (ref 135–145)
Total Bilirubin: 0.6 mg/dL (ref 0.2–1.2)
Total Protein: 6.6 g/dL (ref 6.0–8.3)

## 2014-11-11 LAB — HEMOGLOBIN A1C
Hgb A1c MFr Bld: 5.9 % — ABNORMAL HIGH (ref <5.7)
MEAN PLASMA GLUCOSE: 123 mg/dL — AB (ref <117)

## 2014-11-12 ENCOUNTER — Telehealth: Payer: Self-pay | Admitting: Family Medicine

## 2014-11-12 ENCOUNTER — Encounter: Payer: Self-pay | Admitting: Family Medicine

## 2014-11-12 ENCOUNTER — Ambulatory Visit (INDEPENDENT_AMBULATORY_CARE_PROVIDER_SITE_OTHER): Payer: Medicare Other | Admitting: Family Medicine

## 2014-11-12 ENCOUNTER — Encounter: Payer: Self-pay | Admitting: *Deleted

## 2014-11-12 VITALS — BP 128/74 | HR 60 | Ht 66.0 in | Wt 237.2 lb

## 2014-11-12 DIAGNOSIS — G4733 Obstructive sleep apnea (adult) (pediatric): Secondary | ICD-10-CM

## 2014-11-12 DIAGNOSIS — E876 Hypokalemia: Secondary | ICD-10-CM | POA: Diagnosis not present

## 2014-11-12 DIAGNOSIS — E119 Type 2 diabetes mellitus without complications: Secondary | ICD-10-CM | POA: Diagnosis not present

## 2014-11-12 DIAGNOSIS — Z0000 Encounter for general adult medical examination without abnormal findings: Secondary | ICD-10-CM | POA: Diagnosis not present

## 2014-11-12 DIAGNOSIS — Z7189 Other specified counseling: Secondary | ICD-10-CM

## 2014-11-12 DIAGNOSIS — I10 Essential (primary) hypertension: Secondary | ICD-10-CM

## 2014-11-12 DIAGNOSIS — F324 Major depressive disorder, single episode, in partial remission: Secondary | ICD-10-CM

## 2014-11-12 DIAGNOSIS — Z125 Encounter for screening for malignant neoplasm of prostate: Secondary | ICD-10-CM | POA: Diagnosis not present

## 2014-11-12 DIAGNOSIS — F325 Major depressive disorder, single episode, in full remission: Secondary | ICD-10-CM

## 2014-11-12 DIAGNOSIS — E7800 Pure hypercholesterolemia, unspecified: Secondary | ICD-10-CM | POA: Insufficient documentation

## 2014-11-12 DIAGNOSIS — F1021 Alcohol dependence, in remission: Secondary | ICD-10-CM | POA: Diagnosis not present

## 2014-11-12 MED ORDER — POTASSIUM CHLORIDE CRYS ER 20 MEQ PO TBCR: 40.0000 meq | EXTENDED_RELEASE_TABLET | Freq: Every day | ORAL | Status: DC

## 2014-11-12 MED ORDER — AMLODIPINE BESYLATE 10 MG PO TABS: 10.0000 mg | ORAL_TABLET | Freq: Every day | ORAL | Status: DC

## 2014-11-12 MED ORDER — ATENOLOL-CHLORTHALIDONE 50-25 MG PO TABS: 1.0000 | ORAL_TABLET | Freq: Every day | ORAL | Status: DC

## 2014-11-12 MED ORDER — ATORVASTATIN CALCIUM 10 MG PO TABS: 10.0000 mg | ORAL_TABLET | Freq: Every day | ORAL | Status: DC

## 2014-11-12 NOTE — Telephone Encounter (Signed)
Abstracted into immunization section of chart.

## 2014-11-12 NOTE — Patient Instructions (Addendum)
HEALTH MAINTENANCE RECOMMENDATIONS:  It is recommended that you get at least 30 minutes of aerobic exercise at least 5 days/week (for weight loss, you may need as much as 60-90 minutes). This can be any activity that gets your heart rate up. This can be divided in 10-15 minute intervals if needed, but try and build up your endurance at least once a week.  Weight bearing exercise is also recommended twice weekly.  Eat a healthy diet with lots of vegetables, fruits and fiber.  "Colorful" foods have a lot of vitamins (ie green vegetables, tomatoes, red peppers, etc).  Limit sweet tea, regular sodas and alcoholic beverages, all of which has a lot of calories and sugar.  Up to 2 alcoholic drinks daily may be beneficial for men (unless trying to lose weight, watch sugars).  Drink a lot of water.  Sunscreen of at least SPF 30 should be used on all sun-exposed parts of the skin when outside between the hours of 10 am and 4 pm (not just when at beach or pool, but even with exercise, golf, tennis, and yard work!)  Use a sunscreen that says "broad spectrum" so it covers both UVA and UVB rays, and make sure to reapply every 1-2 hours.  Remember to change the batteries in your smoke detectors when changing your clock times in the spring and fall.  Use your seat belt every time you are in a car, and please drive safely and not be distracted with cell phones and texting while driving.  I recommend getting your shingles vaccine (take the written prescription to a pharmacy that gives them).  Please fill out the Living Will and power of attorney paperwork that we gave you in the past; get it notarized and give [place] a copy.  Continue to cut back on egg yolks and cheese and red meat in the diet. Start lipitor 10mg  along with coenzyme Q10 to help minimize the muscle side effects.  We are referring you to nutritionist due to your diabetes, weight gain, high cholesterol.  You NEED to get this under control.  Exercise at  least 30 minutes daily; limit portion sizes, and you need to lose the weight that you have gained, or a diabetes medication may be in your future also.  Increase your potassium supplement--take 2 each day.  It is very important to wear your appliance to treat your sleep apnea.  Sleep Apnea  Sleep apnea is a sleep disorder characterized by abnormal pauses in breathing while you sleep. When your breathing pauses, the level of oxygen in your blood decreases. This causes you to move out of deep sleep and into light sleep. As a result, your quality of sleep is poor, and the system that carries your blood throughout your body (cardiovascular system) experiences stress. If sleep apnea remains untreated, the following conditions can develop:  High blood pressure (hypertension).  Coronary artery disease.  Inability to achieve or maintain an erection (impotence).  Impairment of your thought process (cognitive dysfunction). There are three types of sleep apnea: 1. Obstructive sleep apnea--Pauses in breathing during sleep because of a blocked airway. 2. Central sleep apnea--Pauses in breathing during sleep because the area of the brain that controls your breathing does not send the correct signals to the muscles that control breathing. 3. Mixed sleep apnea--A combination of both obstructive and central sleep apnea. RISK FACTORS The following risk factors can increase your risk of developing sleep apnea:  Being overweight.  Smoking.  Having narrow passages in  your nose and throat.  Being of older age.  Being male.  Alcohol use.  Sedative and tranquilizer use.  Ethnicity. Among individuals younger than 35 years, African Americans are at increased risk of sleep apnea. SYMPTOMS   Difficulty staying asleep.  Daytime sleepiness and fatigue.  Loss of energy.  Irritability.  Loud, heavy snoring.  Morning headaches.  Trouble concentrating.  Forgetfulness.  Decreased interest in  sex. DIAGNOSIS  In order to diagnose sleep apnea, your caregiver will perform a physical examination. Your caregiver may suggest that you take a home sleep test. Your caregiver may also recommend that you spend the night in a sleep lab. In the sleep lab, several monitors record information about your heart, lungs, and brain while you sleep. Your leg and arm movements and blood oxygen level are also recorded. TREATMENT The following actions may help to resolve mild sleep apnea:  Sleeping on your side.   Using a decongestant if you have nasal congestion.   Avoiding the use of depressants, including alcohol, sedatives, and narcotics.   Losing weight and modifying your diet if you are overweight. There also are devices and treatments to help open your airway:  Oral appliances. These are custom-made mouthpieces that shift your lower jaw forward and slightly open your bite. This opens your airway.  Devices that create positive airway pressure. This positive pressure "splints" your airway open to help you breathe better during sleep. The following devices create positive airway pressure:  Continuous positive airway pressure (CPAP) device. The CPAP device creates a continuous level of air pressure with an air pump. The air is delivered to your airway through a mask while you sleep. This continuous pressure keeps your airway open.  Nasal expiratory positive airway pressure (EPAP) device. The EPAP device creates positive air pressure as you exhale. The device consists of single-use valves, which are inserted into each nostril and held in place by adhesive. The valves create very little resistance when you inhale but create much more resistance when you exhale. That increased resistance creates the positive airway pressure. This positive pressure while you exhale keeps your airway open, making it easier to breath when you inhale again.  Bilevel positive airway pressure (BPAP) device. The BPAP device  is used mainly in patients with central sleep apnea. This device is similar to the CPAP device because it also uses an air pump to deliver continuous air pressure through a mask. However, with the BPAP machine, the pressure is set at two different levels. The pressure when you exhale is lower than the pressure when you inhale.  Surgery. Typically, surgery is only done if you cannot comply with less invasive treatments or if the less invasive treatments do not improve your condition. Surgery involves removing excess tissue in your airway to create a wider passage way. Document Released: 07/21/2002 Document Revised: 11/25/2012 Document Reviewed: 12/07/2011 [facility] Patient Information 2015 [place], [place]. This information is not intended to replace advice given to you by your health care provider. Make sure you discuss any questions you have with your health care provider.

## 2014-11-12 NOTE — Telephone Encounter (Signed)
Pt had his Shingles shot today at [facility] on [address]

## 2014-11-12 NOTE — Progress Notes (Signed)
Chief Complaint  Patient presents with  . Hypertension    fasting med check plus/AWV. Only concerns today is place on right hand that is cracking, has been there for many years.    Shannon Stone is a 69 y.o. male who presents for annual wellness visit and follow-up on chronic medical conditions.  He has the following concerns:  Hypertension follow-up: Blood pressures are not checked elsewhere.  He has a cuff at home, but hasn't been checking. Denies dizziness, headaches, chest pain. Denies side effects of medications.  Hyperlipidemia: He eats 2 poached eggs every other day, down from daily; he cut back some on the cheese intake. He recalls having a problem with statins (myalgias).  Can't recall which one he took, possibly pravastatin.  OSA: He has not being using his oral appliance for treatment of sleep apnea. He wakes up feeling unrefreshed in the morning, sometimes is tired during the day.  Diabetes (met by criteria of having 2 fasting sugars >126). He has gained weight since his last visit, up about 20#. Exercise is intermittent (just yardwork and walking around the house). Hasn't been walking regularly outside or using Nordic[employer]. He had diabetic eye exam within the last 3 months (doesn't appear that they sent [place] notes). He denies numbness, tingling or pain in his feet.  Alcoholism--he has been sober since doing inpatient rehab at [facility] in 2010. He is active in [place], attending daily meetings, as well as volunteering at [facility].  He also works at [employer]  Depression/anxiety: He reports that his moods are good, and denies side effects from his medication.   Hypogonadism--He has never taken any testosterone replacement.  He saw Dr. Serita Butcher in the past, testosterone was low once, but normal on repeat. He is back to having fatigue, but likely related to his untreated sleep apnea and his weight. He also has ED.  He saw Dr. Alfonso Ramus at [facility] for his neck  and back (he had switched over there after not getting much benefit from injections from Dr. Lenox Ahr no longer is seeing him. He completed PT; never got TENS unit (as he mentioned helping while in PT). He takes 1 Aleve twice daily which helps control the pain. He is having some recurrent right shoulder pain.   Immunization History  Administered Date(s) Administered  . Influenza Split 05/02/2012, 04/24/2013  . Influenza, High Dose Seasonal PF 05/07/2014  . Pneumococcal Conjugate-13 05/07/2014  . Pneumococcal Polysaccharide-23 04/24/2013  . Td 08/15/2003  . Tdap 08/06/2013   He looked into zostavax, but still about $187. He is now willing to pay for this. Last colonoscopy: 5/08, due again in 10 years (he checked with them last year about whether he was due, and told not yet) Last PSA: 04/2014 Dentist: twice yearly Ophtho: regular (has macular degeneration) Exercise: no regular exercise.  Other doctors caring for patient include: Ophtho: On [address], he can't recall the name ([facility]?) Urology: Dr. Serita Butcher (not seen recently, but in past) Dermatologist: [facility] (in the past, not in 3 yrs) GI: at [facility] ENT: Dr. Erik Obey Podiatrist: Dr. Paulla Dolly at [facility] (in the past) Dentist: Dr. Quincy Simmonds  Depression screen:  See scanned questionnaire.  Notable for some trouble falling asleep, blaming self for things, and sometimes feeling tired, sometimes has trouble concentrating (not new) ADL screen:  See scanned questionnaire.  Notable for ringing in his right ear and hearing loss (related to noise exposure).  End of Life Discussion:  Patient does not  have a living will and medical power of attorney. He was given paperwork in the past, still has at home, but hasn't filled it out yet.  Past Medical History  Diagnosis Date  . Complication of anesthesia     DIFFICULTY URINATING  . Hypertension   . 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  Anxiety   . Sleep apnea     (done at [facility]); central and obstructive; using device from dentist  . 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 Depression     and anxiety  . Esophageal reflux   . Urgency of urination   . Nocturia   . Impotence of organic origin     erectile dysfunction--has seen Dr. Serita Butcher  . Psoriasis   . Rosacea   . Dry eye   . Diabetes mellitus without complication     history of borderline DM/elevated fasting glucose.  . Arthritis     OA-knees, elbow  . Other and unspecified hyperlipidemia     h/o elevated cholesterol  . Elevated LFTs     (ETOH)fatty infiltration on u/s  . Diverticulosis     sigmoid(5/08)  . Neck pain     cervical radiculopathy-Dr. Sherley Bounds; persistent, s/p surgery  . Alcohol dependence     history of abuse/dependence-s/p inpatient rehab at [facility] 08/2008  . Macular degeneration   . Microscopic hematuria     eval by Dr. Serita Butcher  . 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 Periodic limb movements of sleep     per sleep study 11/02  . Plantar fasciitis     treated by Dr. Paulla Dolly (injection)  . Spinal stenosis in cervical region 03/03/14  . Brachial neuritis or radiculitis NOS 03/03/2014    Past Surgical History  Procedure Laterality Date  . Cervical spine surgery  2011, 2013    Dr. Sherley Bounds  . Knee surgery Left 2013  . Umbilical hernia repair      AS CHILD   . Posterior cervical fusion/foraminotomy  12/01/2011    Procedure: POSTERIOR CERVICAL FUSION/FORAMINOTOMY LEVEL 1;  Surgeon: Eustace Moore, MD;  Location: [facility];  Service: Neurosurgery;  Laterality: Bilateral;  Cervical five-six posterior cervical fusion     History   Social History  . Marital Status: Single    Spouse Name: N/A  . Number of Children: 3  . Years of Education: N/A   Occupational History  . retired Midwife)    Social History Main Topics  . Smoking status: Former Smoker -- 1.00 packs/day  for 18 years    Types: Cigarettes    Quit date: 08/14/1990  . Smokeless tobacco: Never Used  . Alcohol Use: Yes     Comment: zero drinks in 5 years.  sober since 08/26/2008  . Drug Use: No  . Sexual Activity: Not Currently   Other Topics Concern  . Not on file   Social History Narrative   Divorced.  Lives alone, 1 cat.  Retired Conservation officer, nature.  Alcoholic--sober since 03/2992.  Active in AA, daily meetings, and he volunteers at [facility] (where he did rehab), detox at [facility] and at the jail.  2 daughters and a son (1 in [place], 1 in [place], [place], 1 in [place])    Family History  Problem Relation Age of Onset  . Arthritis Mother   .  Hypertension Mother   . Alcohol abuse Mother   . CVA Mother   . Hypertension Father   . Asthma Father   . Dementia Father   . Cancer Paternal Grandfather     stomach  . Hodgkin's lymphoma Sister     history of  . Heart disease Sister     arrhythmia (required cardioversion)--age 15  . Cancer Maternal Uncle     stomach  . Parkinson's disease Paternal Grandmother   . Diabetes Other     amputee    Outpatient Encounter Prescriptions as of 11/12/2014  Medication Sig Note  . amLODipine (NORVASC) 10 MG tablet Take 1 tablet (10 mg total) by mouth daily.   Marland Kitchen aspirin EC 81 MG tablet Take 81 mg by mouth daily.   Marland Kitchen atenolol-chlorthalidone (TENORETIC) 50-25 MG per tablet Take 1 tablet by mouth daily.   Marland Kitchen atorvastatin (LIPITOR) 10 MG tablet Take 1 tablet (10 mg total) by mouth daily.   . clobetasol cream (TEMOVATE) 9.38 % Apply 1 application topically daily.   Marland Kitchen MAGNESIUM CHLORIDE-CALCIUM PO Take 1 tablet by mouth daily.   . metroNIDAZOLE (METROCREAM) 0.75 % cream Apply 1 application topically at bedtime. 08/06/2014: Uses prn with flares  . Multiple Vitamins-Minerals (PRESERVISION AREDS PO) Take 2 tablets by mouth daily.   . naproxen sodium (ANAPROX) 220 MG tablet Take 220 mg by mouth 2 (two) times daily with a meal.   . Omega-3 Fatty Acids (FISH  OIL PO) Take 1 capsule by mouth daily.   Marland Kitchen PARoxetine (PAXIL) 40 MG tablet Take 1 tablet (40 mg total) by mouth every morning.   . potassium chloride SA (K-DUR,KLOR-CON) 20 MEQ tablet Take 2 tablets (40 mEq total) by mouth daily.   Marland Kitchen Propylene Glycol (SYSTANE BALANCE OP) Apply 2 drops to eye daily. 1 drop in each eye once a day.   . sodium chloride (OCEAN) 0.65 % nasal spray Place 1 spray into the nose as needed for congestion.   . thiamine (VITAMIN B-1) 100 MG tablet Take 100 mg by mouth daily.   . Triamcinolone Acetonide (NASACORT ALLERGY 24HR NA) Place 1 spray into the nose. 05/07/2014: Uses as needed for allergies  . [DISCONTINUED] amLODipine (NORVASC) 10 MG tablet TAKE 1 TABLET EVERY DAY   . [DISCONTINUED] atenolol-chlorthalidone (TENORETIC) 50-25 MG per tablet TAKE 1 TABLET EVERY DAY   . [DISCONTINUED] ibuprofen (ADVIL,MOTRIN) 800 MG tablet Take 800 mg by mouth 3 (three) times daily.  11/12/2014: Received from: External Pharmacy  . [DISCONTINUED] potassium chloride SA (K-DUR,KLOR-CON) 20 MEQ tablet TAKE 1 TABLET EVERY DAY     Allergies  Allergen Reactions  . Betadine [Povidone Iodine] Rash   ROS: The patient denies anorexia, fever, headaches, hoarseness, chest pain, palpitations, dizziness, syncope, dyspnea on exertion, cough, swelling, no melena, hematochezia, indigestion/heartburn, hematuria, dysuria, genital lesions, weakness, tremor, suspicious skin lesions (some rashes and dry skin related to psoriasis; no rosacea flares), abnormal bleeding/bruising, or enlarged lymph nodes. No edema. Moods are good overall. +erectile dysfunction, +nocturia (3-4x/night--prev eval'd by urology), decreased libido. 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 Has seen ENT in the past; planned to get hearing aid, but hasn't yet. Chronic neck pain. On right side feels tight muscles in shoulder, and radiates into arm. On left side, pain sometimes radiates up into his head (very rare). Feels better to raise his arm  over his head, but hurts to put it back down again. Some pain in left lower back with prolonged walking. Some tingling in RA from ulnar  neuropathy (he hardly notices this) Some mild residual weakness in his arms since prior to surgeries, R>L Some urinary urgency Occasional nosebleeds, dry skin Chronic decreased libido  Right shoulder pain (related to neck surgery) Some short-term memory concerns   PHYSICAL EXAM:  BP 128/74 mmHg  Pulse 60  Ht _0  (1.676 m)  Wt 237 lb 3.2 oz (107.593 kg)  BMI 38.30 kg/m2  General Appearance:   Alert, cooperative, no distress, appears stated age  Head:   Normocephalic, without obvious abnormality, atraumatic  Eyes:   PERRL, conjunctiva/corneas clear, EOM's intact, fundi   benign  Ears:   Normal TM's and external ear canals  Nose:  Nares normal, mucosa normal, no drainage or sinus tenderness  Throat:  Lips, mucosa, and tongue normal; teeth and gums normal  Neck:  Supple, no lymphadenopathy; thyroid: no enlargement/tenderness/nodules; no carotid  bruit or JVD  Back:  Spine nontender, no curvature, ROM normal, no CVA tenderness  Lungs:   Clear to auscultation bilaterally without wheezes, rales or ronchi; respirations unlabored  Chest Wall:   No tenderness or deformity  Heart:   Regular rate and rhythm, S1 and S2 normal, no murmur, rub  or gallop  Breast Exam:   No chest wall tenderness, or masses  Abdomen:   Soft, non-tender, nondistended, active bowel sounds,   no masses, no hepatosplenomegaly. +abdominal obesity  Genitalia:   Normal male external genitalia without lesions. Testicles without masses, right larger than left. No inguinal hernias.  Rectal:   Normal sphincter tone. Prostate is not enlarged, smooth, no nodules. Heme negative light brown stool  Extremities:  No clubbing, cyanosis or edema  Pulses:  2+ and symmetric all extremities  Skin:  Skin color, texture,  turgor normal; small psoriatic plaques on buttocks.  Lymph nodes:  Cervical, supraclavicular, and axillary nodes normal  Neurologic:  CNII-XII intact, normal strength, sensation and gait; reflexes 2+ and symmetric throughout   Psych: Normal mood, affect, hygiene and grooming       Lab Results  Component Value Date   CHOL 154 11/10/2014   HDL 34* 11/10/2014   LDLCALC 107* 11/10/2014   TRIG 65 11/10/2014   CHOLHDL 4.5 11/10/2014     Chemistry      Component Value Date/Time   NA 141 11/10/2014 0901   K 3.4* 11/10/2014 0901   CL 105 11/10/2014 0901   CO2 30 11/10/2014 0901   BUN 19 11/10/2014 0901   CREATININE 0.53 11/10/2014 0901   CREATININE 0.56 11/27/2011 1057      Component Value Date/Time   CALCIUM 9.2 11/10/2014 0901   ALKPHOS 50 11/10/2014 0901   AST 14 11/10/2014 0901   ALT 19 11/10/2014 0901   BILITOT 0.6 11/10/2014 0901     Fasting glucose 139  Lab Results  Component Value Date   HGBA1C 5.9* 11/10/2014   Last testosterone was 409 in 04/2014.   ASSESSMENT/PLAN:  Medicare annual wellness visit, initial  Essential hypertension, benign - controlled - Plan: amLODipine (NORVASC) 10 MG tablet, atenolol-chlorthalidone (TENORETIC) 50-25 MG per tablet, Amb ref to Medical Nutrition Therapy-MNT  Depression, major, in remission  Alcoholism in remission  Type 2 diabetes mellitus without complication - W0J okay, but fasting sugars remain high. risks reviewed--must exercise, lose weight and improve diet or meds will be started. refer to nutritionist - Plan: Amb ref to Medical Nutrition Therapy-MNT  Hypokalemia - increase potassium dose; recheck in 3 mos - Plan: potassium chloride SA (K-DUR,KLOR-CON) 20 MEQ tablet  Hypercholesteremia - goal LDL<100  due to diabetes.  He is above goal.  reviewed diet.  refer to nutritionist - Plan: atorvastatin (LIPITOR) 10 MG tablet, Amb ref to Medical Nutrition Therapy-MNT  Advance care planning -  MOST form reviewed and updated.  Full Code/Full Care. encouraged him to fill out Living Will and healthcare POA given last year  OSA (obstructive sleep apnea) - noncompliant with treatment.  risks of untreated OSA were reviewed and encouraged him to use oral appliance   Hyperlipidemia--Continue to cut back on egg yolks and cheese and red meat in the diet.  Start lipitor 21m along with coenzyme Q10 to help minimize the muscle side effects.  Hypokalemia--increase potassium to 2/day  DM--refer to nutritionist. Counseled extensively re: need for exercise, weight loss, risks of diabetes, hyperlipidemia and obesity.  Discussed PSA screening (risks/benefits), recommended at least 30 minutes of aerobic activity at least 5 days/week; proper sunscreen use reviewed; healthy diet and alcohol recommendations (less than or equal to 2 drinks/day) reviewed; regular seatbelt use; changing batteries in smoke detectors. Self-testicular exams. Immunization recommendations discussed-- zostavax recommended, and now willing to pay his portion.  New prescription was given. Colonoscopy recommendations reviewed--it sounds as though he is due 12/2016; given hemasure kit.  End of Life Issues--Reminded to look at and fill out Living Will and healthcare power of attorney forms. MOST form reviewed and updated. Full Code, full care.   F/u 3 mos with fasting labs prior    Medicare Attestation I have personally reviewed: The patient's medical and social history Their use of alcohol, tobacco or illicit drugs Their current medications and supplements The patient's functional ability including ADLs,fall risks, home safety risks, cognitive, and hearing and visual impairment Diet and physical activities Evidence for depression or mood disorders  The patient's weight, height, BMI, and visual acuity have been recorded in the chart.  I have made referrals, counseling, and provided education to the patient based on review of  the above and I have provided the patient with a written personalized care plan for preventive services.     Vikkie Goeden A, MD   11/12/2014

## 2014-11-13 ENCOUNTER — Encounter: Payer: Self-pay | Admitting: Internal Medicine

## 2014-11-20 ENCOUNTER — Other Ambulatory Visit: Payer: Self-pay | Admitting: Family Medicine

## 2014-11-20 NOTE — Addendum Note (Signed)
Addended by: Rita Ohara on: 11/20/2014 03:21 PM   Modules accepted: Level of Service

## 2014-11-24 ENCOUNTER — Encounter: Payer: Medicare Other | Attending: Family Medicine

## 2014-11-24 VITALS — Ht 65.0 in | Wt 236.2 lb

## 2014-11-24 DIAGNOSIS — E119 Type 2 diabetes mellitus without complications: Secondary | ICD-10-CM

## 2014-11-24 DIAGNOSIS — Z713 Dietary counseling and surveillance: Secondary | ICD-10-CM | POA: Insufficient documentation

## 2014-11-25 NOTE — Progress Notes (Signed)
Patient was seen on 11/24/14 for the first of a series of three diabetes self-management courses at [facility].  Patient Education Plan per assessed needs and concerns is to attend four course education program for Diabetes Self Management Education.  The following learning objectives were met by the patient during this class:  Describe diabetes  State some common risk factors for diabetes  Defines the role of glucose and insulin  Identifies type of diabetes and pathophysiology  Describe the relationship between diabetes and cardiovascular risk  State the members of the Healthcare Team  States the rationale for glucose monitoring  State when to test glucose  State their individual Target Range  State the importance of logging glucose readings  Describe how to interpret glucose readings  Identifies A1C target  Explain the correlation between A1c and eAG values  State symptoms and treatment of high blood glucose  State symptoms and treatment of low blood glucose  Explain proper technique for glucose testing  Identifies proper sharps disposal  Handouts given during class include:  Living Well with Diabetes book  Carb Counting and Meal Planning book  Meal Plan Card  Carbohydrate guide  Meal planning worksheet  Low Sodium Flavoring Tips  The diabetes portion plate  O1V to eAG Conversion Chart  Diabetes Medications  Diabetes Recommended Care Schedule  Support Group  Diabetes Success Plan  Core Class Satisfaction Survey  Follow-Up Plan:  Attend core 2

## 2014-12-01 DIAGNOSIS — Z713 Dietary counseling and surveillance: Secondary | ICD-10-CM | POA: Diagnosis not present

## 2014-12-01 DIAGNOSIS — E119 Type 2 diabetes mellitus without complications: Secondary | ICD-10-CM | POA: Diagnosis not present

## 2014-12-01 NOTE — Progress Notes (Signed)

## 2014-12-08 ENCOUNTER — Telehealth: Payer: Self-pay | Admitting: Family Medicine

## 2014-12-08 DIAGNOSIS — E7800 Pure hypercholesterolemia, unspecified: Secondary | ICD-10-CM

## 2014-12-08 DIAGNOSIS — E119 Type 2 diabetes mellitus without complications: Secondary | ICD-10-CM | POA: Diagnosis not present

## 2014-12-08 DIAGNOSIS — Z713 Dietary counseling and surveillance: Secondary | ICD-10-CM | POA: Diagnosis not present

## 2014-12-08 NOTE — Progress Notes (Signed)
Patient was seen on 12/08/14 for the third of a series of three diabetes self-management courses at the [facility].   Catalina Gravel the amount of activity recommended for healthy living . Describe activities suitable for individual needs . Identify ways to regularly incorporate activity into daily life . Identify barriers to activity and ways to over come these barriers  Identify diabetes medications being personally used and their primary action for lowering glucose and possible side effects . Describe role of stress on blood glucose and develop strategies to address psychosocial issues . Identify diabetes complications and ways to prevent them  Explain how to manage diabetes during illness . Evaluate success in meeting personal goal . Establish 2-3 goals that they will plan to diligently work on until they return for the  64-month follow-up visit  Goals:   I will be active 30 minutes or more 5 times a week  I will eat less unhealthy fats by eating more grilled or baked foods  Your patient has identified these potential barriers to change:  Busy schedule  Your patient has identified their diabetes self-care support plan as  On-line Resources  Plan:  Attend Core 4 in 4 months

## 2014-12-08 NOTE — Telephone Encounter (Signed)
[facility] for #90, no refill (labs due again in June). Also okay for monitor

## 2014-12-08 NOTE — Telephone Encounter (Signed)
Pt called and stated that the lipitor is working out and would like a rx sent to [facility]. He also states that he has been going to the nutritionist and they would like for him to have a blood sugar monitor. Pt can be reached at [phone]

## 2014-12-09 MED ORDER — ATORVASTATIN CALCIUM 10 MG PO TABS: 10.0000 mg | ORAL_TABLET | Freq: Every day | ORAL | Status: DC

## 2015-01-18 ENCOUNTER — Telehealth: Payer: Self-pay | Admitting: Family Medicine

## 2015-01-18 NOTE — Telephone Encounter (Signed)
Fax and chart reviewed. It appears that his potassium was low on last lab, and that at his 3/31 visit, I increased his dose to 2/day. They must have sent in a refill request the following week, and Cheri authorized a refill on the rx for 1/day.  Form filled out stating it should be 2/day.  Please fax back (it is in the red folder)

## 2015-01-18 NOTE — Telephone Encounter (Signed)
rcd request from [facility] on clarification of Potassium directions.  Fax placed in folder

## 2015-01-19 NOTE — Telephone Encounter (Signed)
Faxed back form.

## 2015-02-08 ENCOUNTER — Other Ambulatory Visit: Payer: Self-pay

## 2015-02-12 ENCOUNTER — Other Ambulatory Visit: Payer: Medicare Other

## 2015-02-12 DIAGNOSIS — E876 Hypokalemia: Secondary | ICD-10-CM

## 2015-02-12 DIAGNOSIS — E119 Type 2 diabetes mellitus without complications: Secondary | ICD-10-CM | POA: Diagnosis not present

## 2015-02-12 DIAGNOSIS — E7800 Pure hypercholesterolemia, unspecified: Secondary | ICD-10-CM

## 2015-02-12 LAB — COMPREHENSIVE METABOLIC PANEL
ALT: 17 U/L (ref 0–53)
AST: 12 U/L (ref 0–37)
Albumin: 4 g/dL (ref 3.5–5.2)
Alkaline Phosphatase: 49 U/L (ref 39–117)
BUN: 23 mg/dL (ref 6–23)
CALCIUM: 9 mg/dL (ref 8.4–10.5)
CO2: 29 meq/L (ref 19–32)
Chloride: 100 mEq/L (ref 96–112)
Creat: 0.65 mg/dL (ref 0.50–1.35)
Glucose, Bld: 115 mg/dL — ABNORMAL HIGH (ref 70–99)
Potassium: 4.1 mEq/L (ref 3.5–5.3)
Sodium: 143 mEq/L (ref 135–145)
TOTAL PROTEIN: 6.4 g/dL (ref 6.0–8.3)
Total Bilirubin: 0.7 mg/dL (ref 0.2–1.2)

## 2015-02-12 LAB — LIPID PANEL
Cholesterol: 170 mg/dL (ref 0–200)
HDL: 48 mg/dL (ref >=40)
LDL CALC: 107 mg/dL — AB (ref 0–99)
TRIGLYCERIDES: 77 mg/dL (ref <150)
Total CHOL/HDL Ratio: 3.5 Ratio
VLDL: 15 mg/dL (ref 0–40)

## 2015-02-13 LAB — HEMOGLOBIN A1C
Hgb A1c MFr Bld: 5.6 % (ref <5.7)
Mean Plasma Glucose: 114 mg/dL (ref <117)

## 2015-02-17 ENCOUNTER — Ambulatory Visit (INDEPENDENT_AMBULATORY_CARE_PROVIDER_SITE_OTHER): Payer: Medicare Other | Admitting: Family Medicine

## 2015-02-17 ENCOUNTER — Encounter: Payer: Self-pay | Admitting: Family Medicine

## 2015-02-17 VITALS — BP 126/82 | HR 60 | Ht 65.0 in | Wt 230.8 lb

## 2015-02-17 DIAGNOSIS — E7800 Pure hypercholesterolemia, unspecified: Secondary | ICD-10-CM

## 2015-02-17 DIAGNOSIS — I10 Essential (primary) hypertension: Secondary | ICD-10-CM

## 2015-02-17 DIAGNOSIS — E119 Type 2 diabetes mellitus without complications: Secondary | ICD-10-CM | POA: Diagnosis not present

## 2015-02-17 DIAGNOSIS — E669 Obesity, unspecified: Secondary | ICD-10-CM

## 2015-02-17 DIAGNOSIS — E876 Hypokalemia: Secondary | ICD-10-CM

## 2015-02-17 MED ORDER — AMLODIPINE BESYLATE 10 MG PO TABS: 10.0000 mg | ORAL_TABLET | Freq: Every day | ORAL | Status: DC

## 2015-02-17 MED ORDER — POTASSIUM CHLORIDE CRYS ER 20 MEQ PO TBCR: EXTENDED_RELEASE_TABLET | ORAL | Status: DC

## 2015-02-17 MED ORDER — ATENOLOL-CHLORTHALIDONE 50-25 MG PO TABS: 1.0000 | ORAL_TABLET | Freq: Every day | ORAL | Status: DC

## 2015-02-17 MED ORDER — ATORVASTATIN CALCIUM 20 MG PO TABS: 10.0000 mg | ORAL_TABLET | Freq: Every day | ORAL | Status: DC

## 2015-02-17 NOTE — Patient Instructions (Signed)
  Increase lipitor to 20mg  daily. Continue lowfat, low cholesterol diet. If you develop any muscle ache as a side effect, start taking coenzyme Q10 daily, along with the lipitor.  If you still cannot tolerate the 20mg  dose of lipitor, please call and let me know.  Try and do your Nordictrak (or walking or some other aerobic exercise) at least 30 minutes 5 days/week.  Consider working your way back up to 45 minutes, as you used to do.   Use neosporin to the abrasion on your left lower leg.

## 2015-02-17 NOTE — Progress Notes (Signed)
Chief Complaint  Patient presents with  . med check    med check- had blood work last week   Diabetes:  He has seen nutritionist since his last visit. He has gone a few times and has another one scheduled in August.  He has been eating more green vegetables, cut back to just 2 small slices of bread/day (cut out bread with lunch and dinner).  Cut back on potatoes.  He has lost 6.4# since his last visit. He is active doing yardwork, but if unable, he does Nordictrak, about 2-3 times/week, for 30 minutes (in the past he used to do 45 mins daily).  Hyperlipidemia follow-up:  Patient is reportedly following a low-fat, low cholesterol diet.  Compliant with medications and denies medication side effects. He has seen nutritionist, and had labs prior to visit.  He thinks he forgot to take his cholesterol medication for a full week, all of last week,  He just realized it earlier this week.  Hypokalemia:  His potassium dose was increased at his last visit.  Had labs rechecked prior to today's visit. Denies muscle cramps.  Hypertension follow-up:  Blood pressures elsewhere are 118-140 (if rushing)/70's-80, mostly 120-130/70's.  Denies dizziness, headaches, chest pain.  Denies side effects of medications.  PMH, PSH, SH and FH were updated and reviewed.  Outpatient Encounter Prescriptions as of 02/17/2015  Medication Sig Note  . amLODipine (NORVASC) 10 MG tablet Take 1 tablet (10 mg total) by mouth daily.   Marland Kitchen aspirin EC 81 MG tablet Take 81 mg by mouth daily.   Marland Kitchen atenolol-chlorthalidone (TENORETIC) 50-25 MG per tablet Take 1 tablet by mouth daily.   Marland Kitchen atorvastatin (LIPITOR) 20 MG tablet Take 0.5 tablets (10 mg total) by mouth daily.   . Multiple Vitamins-Minerals (PRESERVISION AREDS PO) Take 2 tablets by mouth daily.   . naproxen sodium (ANAPROX) 220 MG tablet Take 220 mg by mouth 2 (two) times daily with a meal. 02/17/2015: Takes twice daily for neck/shoulder pain  . Omega-3 Fatty Acids (FISH OIL PO) Take 1  capsule by mouth daily.   Marland Kitchen PARoxetine (PAXIL) 40 MG tablet Take 1 tablet (40 mg total) by mouth every morning.   . potassium chloride SA (K-DUR,KLOR-CON) 20 MEQ tablet TAKE 2 TABLET EVERY DAY   . Propylene Glycol (SYSTANE BALANCE OP) Apply 2 drops to eye daily. 1 drop in each eye once a day.   . thiamine (VITAMIN B-1) 100 MG tablet Take 100 mg by mouth daily.   . Triamcinolone Acetonide (NASACORT ALLERGY 24HR NA) Place 1 spray into the nose. 05/07/2014: Uses as needed for allergies  . [DISCONTINUED] amLODipine (NORVASC) 10 MG tablet TAKE 1 TABLET EVERY DAY   . [DISCONTINUED] atenolol-chlorthalidone (TENORETIC) 50-25 MG per tablet TAKE 1 TABLET EVERY DAY   . [DISCONTINUED] atorvastatin (LIPITOR) 10 MG tablet Take 1 tablet (10 mg total) by mouth daily.   . [DISCONTINUED] potassium chloride SA (K-DUR,KLOR-CON) 20 MEQ tablet TAKE 1 TABLET EVERY DAY (Patient taking differently: TAKE 2 TABLET EVERY DAY)   . clobetasol cream (TEMOVATE) 7.40 % Apply 1 application topically daily.   . metroNIDAZOLE (METROCREAM) 0.75 % cream Apply 1 application topically at bedtime. 08/06/2014: Uses prn with flares  . [DISCONTINUED] MAGNESIUM CHLORIDE-CALCIUM PO Take 1 tablet by mouth daily.   . [DISCONTINUED] sodium chloride (OCEAN) 0.65 % nasal spray Place 1 spray into the nose as needed for congestion.    No facility-administered encounter medications on file as of 02/17/2015.   Allergies  Allergen Reactions  .  Betadine [Povidone Iodine] Rash   ROS: no fever, chills, URI symptoms, headaches, dizziness, chest pain, GI, GU complaints.  +neck/shoulder pain. No bleeding, bruising, rash, depression, or other complaints; see HPI  PHYSICAL EXAM: BP 126/82 mmHg  Pulse 60  Ht 5\' 5"  (1.651 m)  Wt 230 lb 12.8 oz (104.69 kg)  BMI 38.41 kg/m2 Well developed, pleasant, obese male in no distress HEENT: PERRL, EOMI, conjunctiva clear Neck: no lymphadenopathy, thyromegaly or carotid bruit Heart: regular rate and rhythm Lungs:  clear bilaterally Abdomen: soft, obese, nontender, no organomegaly or mass Extremities: 2+ pulses, normal sensation.  No edema.  Superficially engorged small veins anterior ankles bilaterally.  Abrasion left anterior lower leg, without infection. Neuro: alert and oriented. Normal strength, gait, cranial nerves Psych: normal mood, affect, hygiene and grooming    Chemistry      Component Value Date/Time   NA 143 02/12/2015 0001   K 4.1 02/12/2015 0001   CL 100 02/12/2015 0001   CO2 29 02/12/2015 0001   BUN 23 02/12/2015 0001   CREATININE 0.65 02/12/2015 0001   CREATININE 0.56 11/27/2011 1057      Component Value Date/Time   CALCIUM 9.0 02/12/2015 0001   ALKPHOS 49 02/12/2015 0001   AST 12 02/12/2015 0001   ALT 17 02/12/2015 0001   BILITOT 0.7 02/12/2015 0001     Fasting glucose 115 Lab Results  Component Value Date   HGBA1C 5.6 02/12/2015    Lab Results  Component Value Date   CHOL 170 02/12/2015   HDL 48 02/12/2015   LDLCALC 107* 02/12/2015   TRIG 77 02/12/2015   CHOLHDL 3.5 02/12/2015   ASSESSMENT/PLAN  Essential hypertension, benign - controlled - Plan: amLODipine (NORVASC) 10 MG tablet, atenolol-chlorthalidone (TENORETIC) 50-25 MG per tablet  Type 2 diabetes mellitus without complication - improved. complete DM education. increase exercise, continue weight loss and proper diet. - Plan: Microalbumin / creatinine urine ratio, Glucose, random  Hypercholesteremia - goal LDL<100. Increase lipitor to 20mg . continue lowfat, low cholesteroldiet - Plan: atorvastatin (LIPITOR) 20 MG tablet, Lipid panel, Hepatic function panel  Hypokalemia - adequately replaced on current dose, continue - Plan: potassium chloride SA (K-DUR,KLOR-CON) 20 MEQ tablet  Obesity (BMI 30-39.9) - increase exercise; continue healthy diet, portion control. further weight loss encouraged.    HTN--at goal Hypokalemia--resolved; continue current potassium dose Hyperlipidemia--still not at goal, goal  LDL<100 DM--improved.  Increase lipitor to 20mg  daily. Continue lowfat, low cholesterol diet. If you develop any muscle ache as a side effect, start taking coenzyme Q10 daily, along with the lipitor.  If you still cannot tolerate the 20mg  dose of lipitor, please call and let me know.  Try and do your Nordictrak (or walking or some other aerobic exercise) at least 30 minutes 5 days/week.  Consider working your way back up to 45 minutes, as you used to do.    Her prefers coming every 3 months to keep him accountable.  Lipids, lft, glucose prior to next visit in 3 mos; (not A1c since normal)  (AWV was March)

## 2015-02-18 LAB — MICROALBUMIN / CREATININE URINE RATIO
Creatinine, Urine: 112.5 mg/dL
MICROALB/CREAT RATIO: 3.6 mg/g (ref 0.0–30.0)
Microalb, Ur: 0.4 mg/dL (ref <2.0)

## 2015-03-27 IMAGING — CR DG SHOULDER 2+V*R*
3 series · 3 of 3 positions shown · non-contrast
Comparison: None.

CLINICAL DATA: Right neck pain radiating down to the right
shoulder.

EXAM:
RIGHT SHOULDER - 2+ VIEW

[w shoulder ap internal righ *]
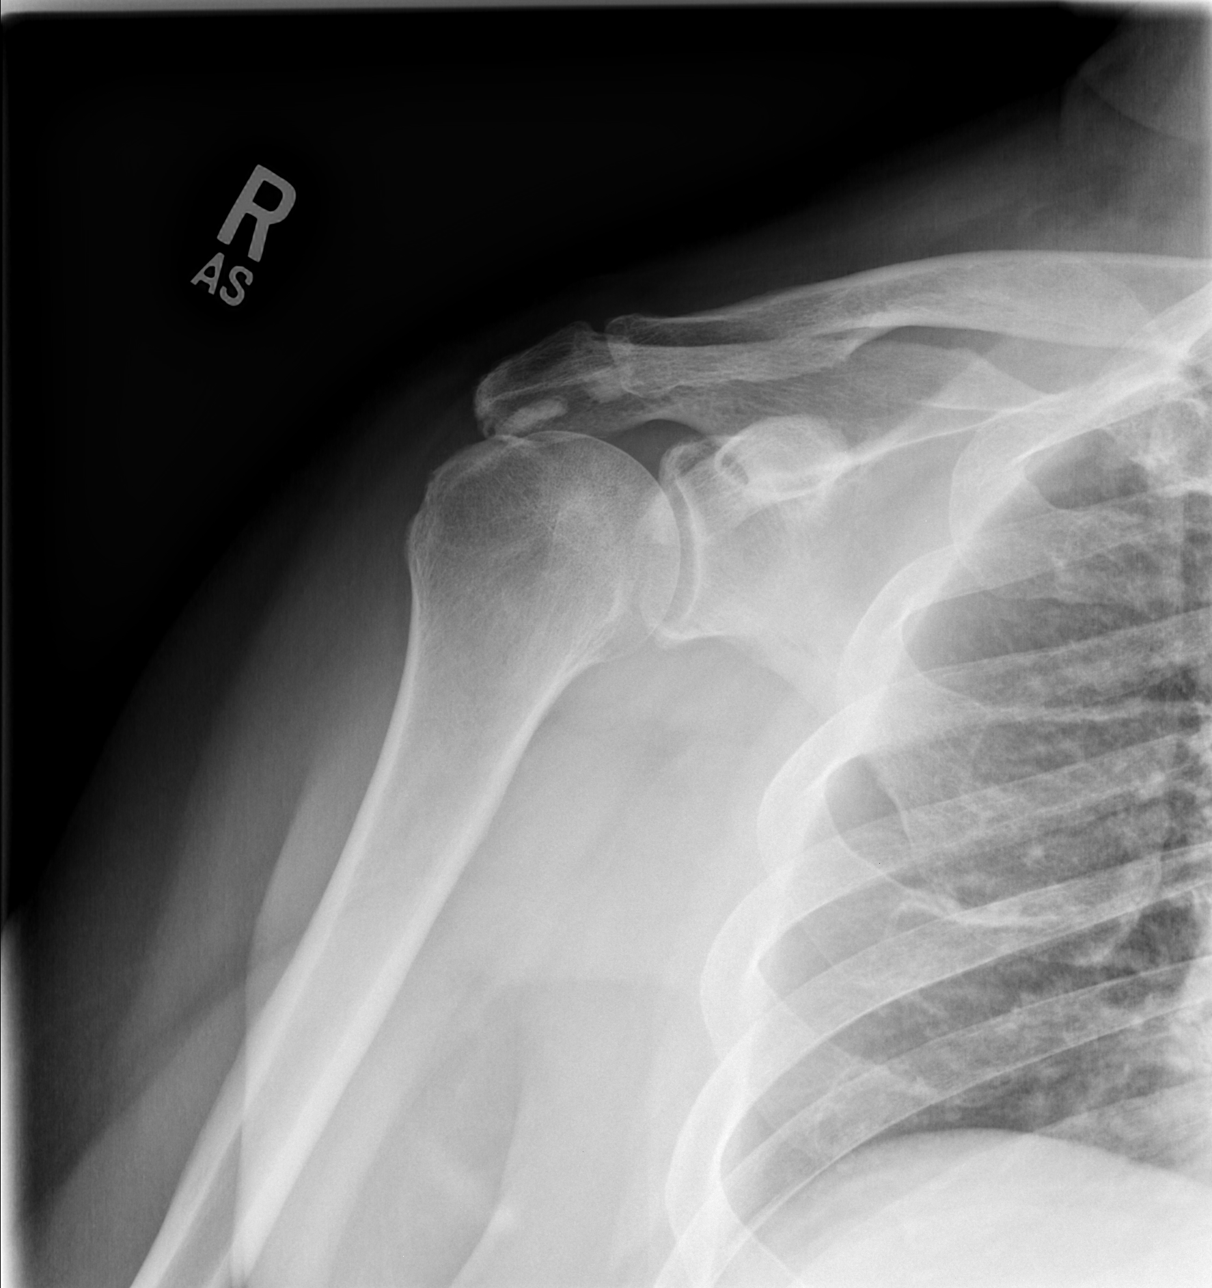

[w shoulder ap external righ *]
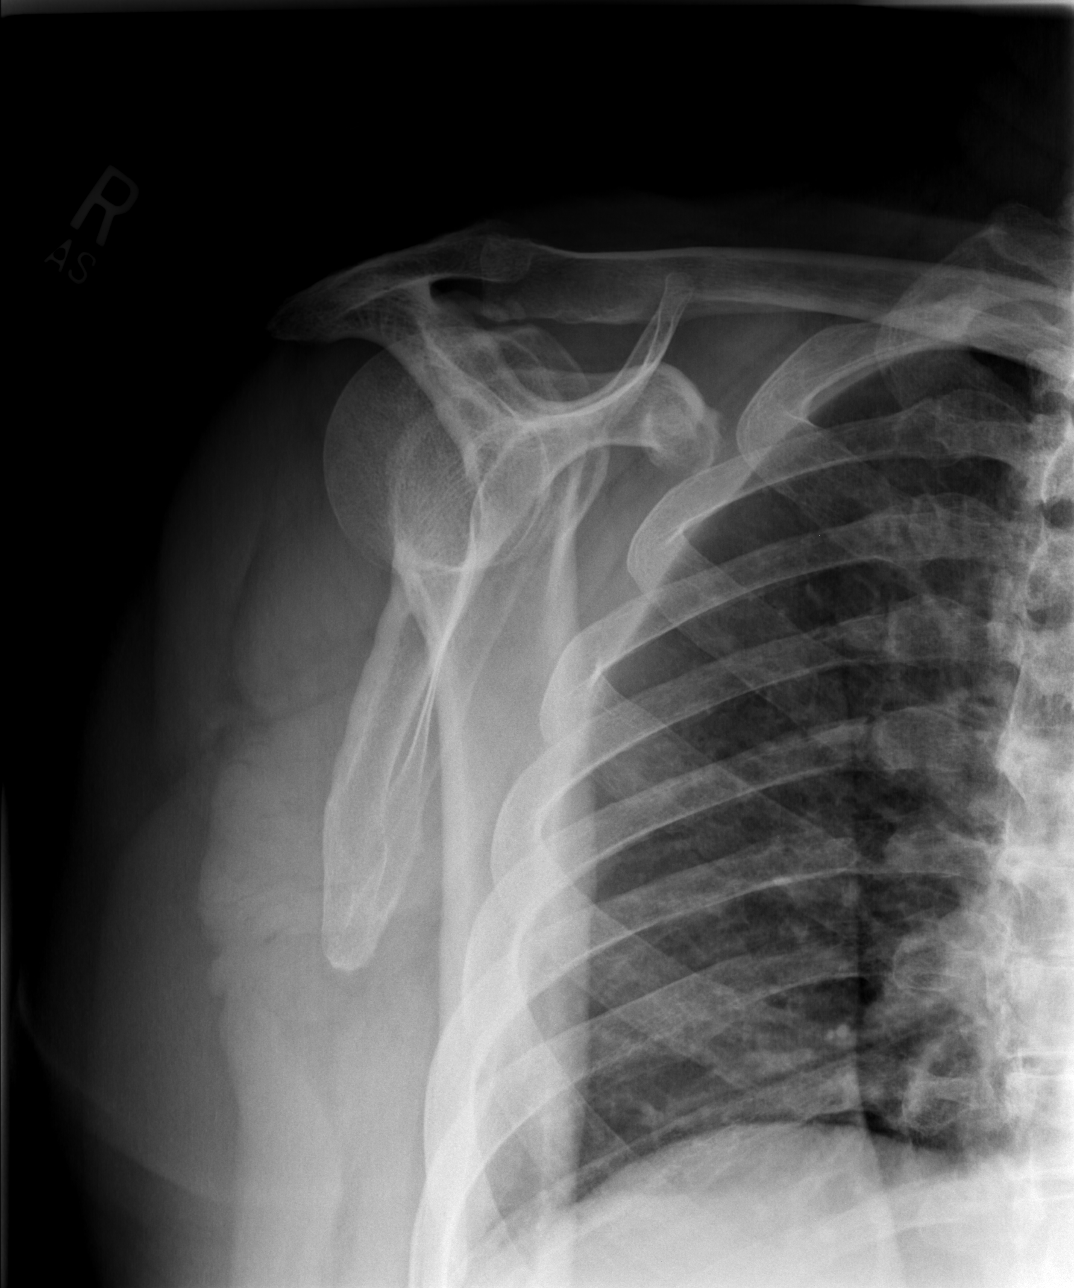

[w shoulder axillary right *]
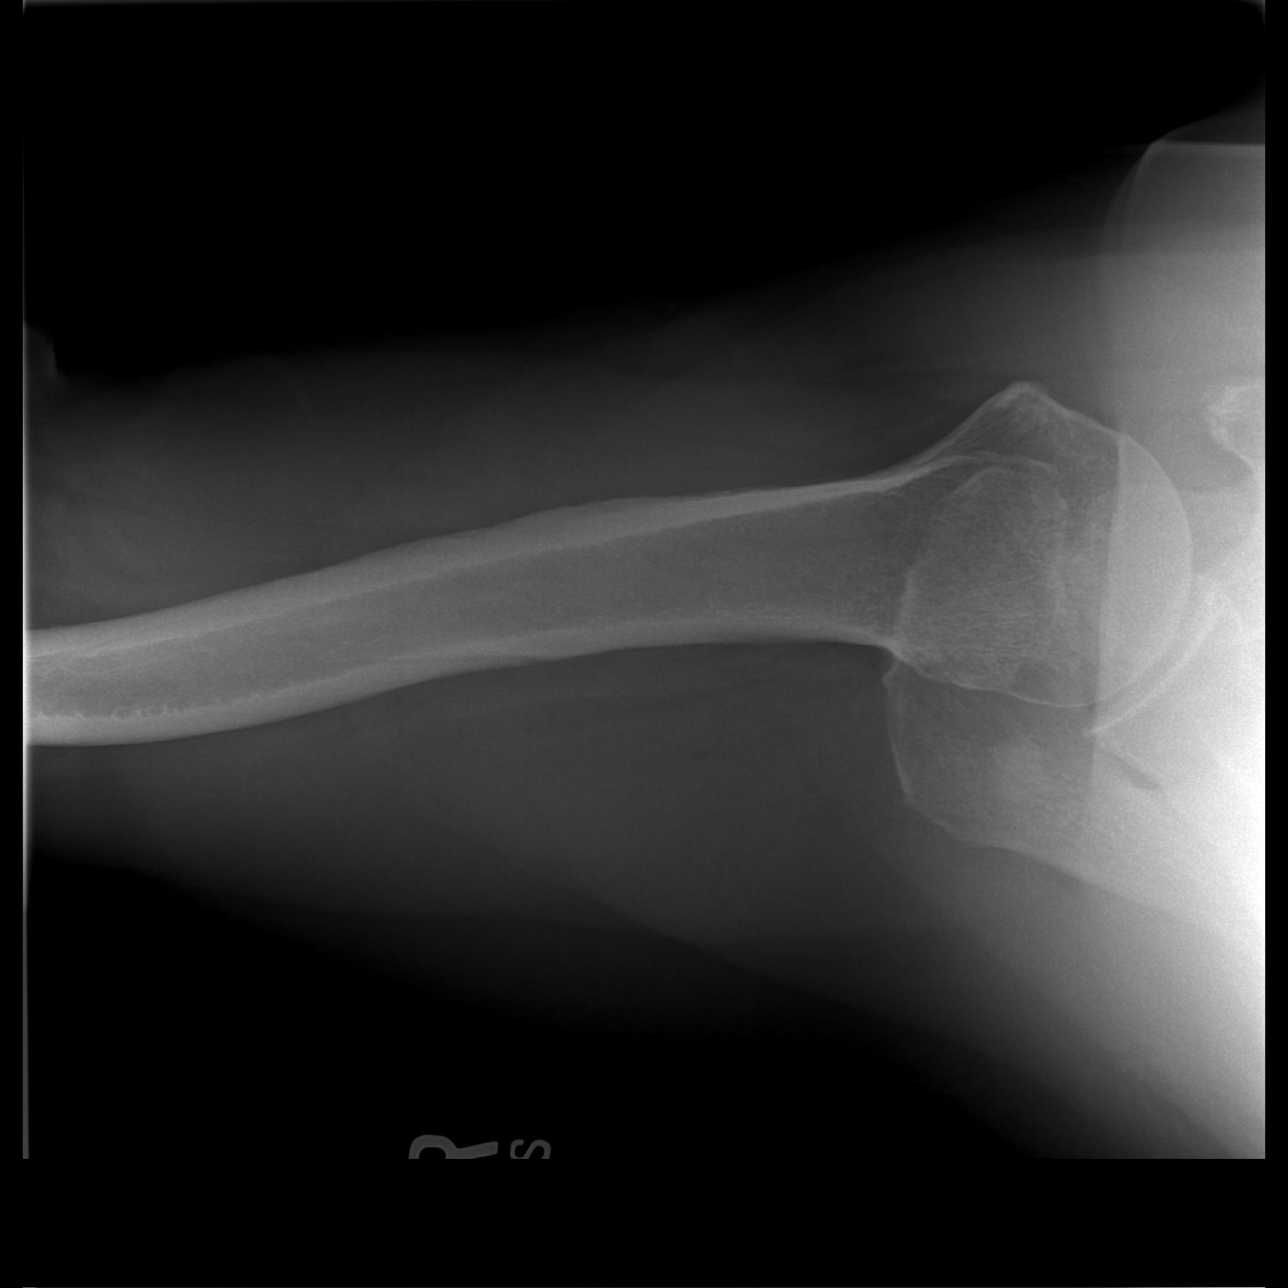

[3 of 3 positions shown; findings below may reference images not displayed]

FINDINGS: 1.7 cm oval-shaped calcification in the acromiohumeral space. Mild
degenerative spurring of the glenoid. Suspected sclerotic lesion in
the posterior acromion versus calcific structure in the subacromial
subdeltoid bursa.
IMPRESSION: 1. Calcific tendinopathy in the supraspinatus tendon. Posterior
acromial bone island versus a subacromial subdeltoid bursa calcific
fragment.

## 2015-04-07 ENCOUNTER — Ambulatory Visit: Payer: Medicare Other

## 2015-04-22 ENCOUNTER — Other Ambulatory Visit: Payer: Self-pay | Admitting: Family Medicine

## 2015-05-03 DIAGNOSIS — Z23 Encounter for immunization: Secondary | ICD-10-CM | POA: Diagnosis not present

## 2015-05-17 ENCOUNTER — Other Ambulatory Visit: Payer: Medicare Other

## 2015-05-18 ENCOUNTER — Other Ambulatory Visit: Payer: Medicare Other

## 2015-05-18 DIAGNOSIS — E7800 Pure hypercholesterolemia, unspecified: Secondary | ICD-10-CM | POA: Diagnosis not present

## 2015-05-18 DIAGNOSIS — E119 Type 2 diabetes mellitus without complications: Secondary | ICD-10-CM | POA: Diagnosis not present

## 2015-05-18 LAB — LIPID PANEL
Cholesterol: 110 mg/dL — ABNORMAL LOW (ref 125–200)
HDL: 50 mg/dL (ref >=40)
LDL CALC: 51 mg/dL (ref <130)
TRIGLYCERIDES: 43 mg/dL (ref <150)
Total CHOL/HDL Ratio: 2.2 Ratio (ref <=5.0)
VLDL: 9 mg/dL (ref <30)

## 2015-05-18 LAB — HEPATIC FUNCTION PANEL
ALBUMIN: 4 g/dL (ref 3.6–5.1)
ALT: 18 U/L (ref 9–46)
AST: 13 U/L (ref 10–35)
Alkaline Phosphatase: 53 U/L (ref 40–115)
BILIRUBIN INDIRECT: 0.4 mg/dL (ref 0.2–1.2)
Bilirubin, Direct: 0.2 mg/dL (ref <=0.2)
Total Bilirubin: 0.6 mg/dL (ref 0.2–1.2)
Total Protein: 6.3 g/dL (ref 6.1–8.1)

## 2015-05-18 LAB — GLUCOSE, RANDOM: Glucose, Bld: 118 mg/dL — ABNORMAL HIGH (ref 65–99)

## 2015-05-20 ENCOUNTER — Ambulatory Visit (INDEPENDENT_AMBULATORY_CARE_PROVIDER_SITE_OTHER): Payer: Medicare Other | Admitting: Family Medicine

## 2015-05-20 ENCOUNTER — Other Ambulatory Visit: Payer: Self-pay | Admitting: *Deleted

## 2015-05-20 ENCOUNTER — Encounter: Payer: Self-pay | Admitting: Family Medicine

## 2015-05-20 ENCOUNTER — Other Ambulatory Visit: Payer: Self-pay | Admitting: Family Medicine

## 2015-05-20 VITALS — BP 120/68 | HR 60 | Ht 66.0 in | Wt 239.0 lb

## 2015-05-20 DIAGNOSIS — E669 Obesity, unspecified: Secondary | ICD-10-CM | POA: Diagnosis not present

## 2015-05-20 DIAGNOSIS — E7800 Pure hypercholesterolemia, unspecified: Secondary | ICD-10-CM

## 2015-05-20 DIAGNOSIS — E119 Type 2 diabetes mellitus without complications: Secondary | ICD-10-CM | POA: Diagnosis not present

## 2015-05-20 DIAGNOSIS — I10 Essential (primary) hypertension: Secondary | ICD-10-CM

## 2015-05-20 MED ORDER — ACCU-CHEK AVIVA PLUS W/DEVICE KIT
1.0000 | PACK | Freq: Two times a day (BID) | Status: DC
Start: 2015-05-20 — End: 2017-10-10

## 2015-05-20 MED ORDER — GLUCOSE BLOOD VI STRP: ORAL_STRIP | Status: DC

## 2015-05-20 MED ORDER — ATORVASTATIN CALCIUM 40 MG PO TABS: 40.0000 mg | ORAL_TABLET | Freq: Every day | ORAL | Status: DC

## 2015-05-20 MED ORDER — LANCETS ULTRA THIN 30G MISC
1.0000 | Freq: Two times a day (BID) | Status: DC
Start: 2015-05-20 — End: 2017-10-04

## 2015-05-20 NOTE — Progress Notes (Signed)
Chief Complaint  Patient presents with  . Hypertension    nonfasting med check, labs already done.   Patient presents for 3 month f/u.  He had requested to be seen every 3 mos, rather than in 6 (as was recommended).  He has been tired lately. He has been using the oral appliance from the dentist for sleep apnea.  He usually wakes up feeling refreshed, if he goes to bed tired.  If he naps in the afternoon, he doesn't sleep as well. He tends to nap 2-3 times/week.  Hypertension follow-up: Blood pressures at [facility] and [facility] 120-130/60's-70's. Denies dizziness, headaches, chest pain. Denies any muscle cramps or spasms. Denies side effects of medications. He is no longer using his Nordictrak, just doing yardwork.  He has gained 8# since his last visit.   Hyperlipidemia follow-up: Patient is reportedly following a low-fat, low cholesterol diet. Compliant with medications and denies medication side effects. After last lipids, his atorvastatin dose was supposed to be increased to 20mg  daily. There was some miscommunication and confusion with the medication.  The prescription, in error, said to take 1/2 tablet daily (of 20mg  tablet) but he has actually been taking 1 full pill twice daily.  Denies any side effects.  Diabetes--he is trying to limit his carbs (and only eat them in the morning, if he eats them). He hasn't been checking his blood sugars; needs more strips. Last eye exam was about a year ago.  PMH, PSH, SH reviewed. Current Outpatient Prescriptions on File Prior to Visit  Medication Sig Dispense Refill  . amLODipine (NORVASC) 10 MG tablet Take 1 tablet (10 mg total) by mouth daily. 90 tablet 1  . aspirin EC 81 MG tablet Take 81 mg by mouth daily.    Marland Kitchen atenolol-chlorthalidone (TENORETIC) 50-25 MG per tablet Take 1 tablet by mouth daily. 90 tablet 1  . Multiple Vitamins-Minerals (PRESERVISION AREDS PO) Take 2 tablets by mouth daily.    . Omega-3 Fatty Acids (FISH OIL PO) Take 1 capsule by  mouth daily.    Marland Kitchen PARoxetine (PAXIL) 40 MG tablet TAKE 1 TABLET EVERY MORNING 90 tablet 0  . potassium chloride SA (K-DUR,KLOR-CON) 20 MEQ tablet TAKE 2 TABLET EVERY DAY 180 tablet 1  . Propylene Glycol (SYSTANE BALANCE OP) Apply 2 drops to eye daily. 1 drop in each eye once a day.    . thiamine (VITAMIN B-1) 100 MG tablet Take 100 mg by mouth daily.    . Triamcinolone Acetonide (NASACORT ALLERGY 24HR NA) Place 1 spray into the nose.    . clobetasol cream (TEMOVATE) 0.10 % Apply 1 application topically daily.    . metroNIDAZOLE (METROCREAM) 0.75 % cream Apply 1 application topically at bedtime.     No current facility-administered medications on file prior to visit.   (taking 20mg  of atorvastatin BID prior to today's visit).  Allergies  Allergen Reactions  . Betadine [Povidone Iodine] Rash   ROS: no fever, chills, URI symptoms, headaches, dizziness, chest pain, palpitations, edema, muscle cramps, bleeding, bruising, shortness of breath, urinary complaints, GI complaints.  Denies joint pains Regained the weight he had lost, up 8 pounds since last visit. +fatigue. Denies depression, anxiety, or other problems.   PHYSICAL EXAM: BP 120/68 mmHg  Pulse 60  Ht 5\' 6"  (1.676 m)  Wt 239 lb (108.41 kg)  BMI 38.59 kg/m2  Well developed, pleasant, obese male in no distress HEENT: EOMI, conjunctiva clear Neck: no lymphadenopathy, thyromegaly or mass Heart: regular rate and rhythm without murmur Lungs:  clear bilaterally Abdomen: obese, soft, nontender Extremities: no edema, normal pulses Skin: normal turgor, no lesions or rash Psych: normal mood, affect, hygiene and grooming Neuro: alert and oriented. Cranial nerves, strength and gait are normal  Fasting glucose 118  Lab Results  Component Value Date   CHOL 110* 05/18/2015   HDL 50 05/18/2015   LDLCALC 51 05/18/2015   TRIG 43 05/18/2015   CHOLHDL 2.2 05/18/2015   Lab Results  Component Value Date   ALT 18 05/18/2015   AST 13  05/18/2015   ALKPHOS 53 05/18/2015   BILITOT 0.6 05/18/2015    Older labs: Lab Results  Component Value Date   HGBA1C 5.6 02/12/2015   ASSESSMENT/PLAN:  Hypercholesteremia - continue 40mg  atorvastatin, since tolerating this dose without problems - Plan: atorvastatin (LIPITOR) 40 MG tablet  Obesity (BMI 30-39.9) - reviewed diet, exercise, risks of obesity and encouraged regular exercise and weight loss  Essential hypertension, benign - controlled  Type 2 diabetes mellitus without complication, without long-term current use of insulin (HCC) - controlled; encouraged daily exercise, weight loss. refill test strips and lancets so he can resume monitoring sugars   Your cholesterol is excellent--lower than I had intended due to the miscommunication.  HOWEVER, since you are tolerating the 40mg  without any complications (no side effects, liver tests are normal), and the cholesterol is excellent (there is no such thing as too low!), we will continue at the 40mg  dose. We are changing the prescription to be a 40mg  tablet, and TAKE JUST ONE TABLET every day.  Try and avoid naps. You must start getting regular exercise, at least 30 minutes daily. Set an alarm if you need to, or pair it to a favorite TV program. Please get at least 30-45 minutes 5x/week of aerobic exercise. You will find that this will help you lose weight, and it will help with your energy and improve your sleep.  Start checking your blood sugars periodically. Bring your blood sugar list to your next visit. Schedule your annual diabetic eye exam.  F/u 3 months A1c, TSH, PSA to be drawn at visit (last done 04/2014).  Schedule AWV/med check+ for 6 months. If there is a cancellation in January for AWV, then can change to just 1 visit in January.  30 min visit, more than 1/2 spent counseling

## 2015-05-20 NOTE — Patient Instructions (Signed)
  Your cholesterol is excellent--lower than I had intended due to the miscommunication.  HOWEVER, since you are tolerating the 40mg  without any complications (no side effects, liver tests are normal), and the cholesterol is excellent (there is no such thing as too low!), we will continue at the 40mg  dose. We are changing the prescription to be a 40mg  tablet, and TAKE JUST ONE TABLET every day.  Try and avoid naps. You must start getting regular exercise, at least 30 minutes daily. Set an alarm if you need to, or pair it to a favorite TV program. Please get at least 30-45 minutes 5x/week of aerobic exercise. You will find that this will help you lose weight, and it will help with your energy and improve your sleep.  Start checking your blood sugars periodically. Bring your blood sugar list to your next visit.  Schedule your annual diabetic eye exam.

## 2015-06-17 DIAGNOSIS — H353131 Nonexudative age-related macular degeneration, bilateral, early dry stage: Secondary | ICD-10-CM | POA: Diagnosis not present

## 2015-06-17 DIAGNOSIS — E119 Type 2 diabetes mellitus without complications: Secondary | ICD-10-CM | POA: Diagnosis not present

## 2015-06-17 DIAGNOSIS — H5203 Hypermetropia, bilateral: Secondary | ICD-10-CM | POA: Diagnosis not present

## 2015-06-17 DIAGNOSIS — H52223 Regular astigmatism, bilateral: Secondary | ICD-10-CM | POA: Diagnosis not present

## 2015-06-17 DIAGNOSIS — H524 Presbyopia: Secondary | ICD-10-CM | POA: Diagnosis not present

## 2015-08-04 ENCOUNTER — Other Ambulatory Visit: Payer: Self-pay | Admitting: Family Medicine

## 2015-08-05 ENCOUNTER — Other Ambulatory Visit: Payer: Self-pay | Admitting: Family Medicine

## 2015-08-26 ENCOUNTER — Encounter: Payer: Self-pay | Admitting: Family Medicine

## 2015-08-26 ENCOUNTER — Ambulatory Visit (INDEPENDENT_AMBULATORY_CARE_PROVIDER_SITE_OTHER): Payer: PPO | Admitting: Family Medicine

## 2015-08-26 VITALS — BP 130/76 | HR 60 | Ht 66.0 in | Wt 250.0 lb

## 2015-08-26 DIAGNOSIS — F411 Generalized anxiety disorder: Secondary | ICD-10-CM | POA: Diagnosis not present

## 2015-08-26 DIAGNOSIS — F1021 Alcohol dependence, in remission: Secondary | ICD-10-CM

## 2015-08-26 DIAGNOSIS — E669 Obesity, unspecified: Secondary | ICD-10-CM

## 2015-08-26 DIAGNOSIS — G4733 Obstructive sleep apnea (adult) (pediatric): Secondary | ICD-10-CM

## 2015-08-26 DIAGNOSIS — F325 Major depressive disorder, single episode, in full remission: Secondary | ICD-10-CM | POA: Diagnosis not present

## 2015-08-26 DIAGNOSIS — I10 Essential (primary) hypertension: Secondary | ICD-10-CM

## 2015-08-26 DIAGNOSIS — E7800 Pure hypercholesterolemia, unspecified: Secondary | ICD-10-CM

## 2015-08-26 DIAGNOSIS — E119 Type 2 diabetes mellitus without complications: Secondary | ICD-10-CM | POA: Diagnosis not present

## 2015-08-26 LAB — POCT GLYCOSYLATED HEMOGLOBIN (HGB A1C): HEMOGLOBIN A1C: 6.1

## 2015-08-26 NOTE — Progress Notes (Signed)
Chief Complaint  Patient presents with  . Hypertension    fasting med check. Does mention that his hands are extremely dry, uses Eucerin on his hands, rx's are too expensive and sticky.    Hypertension follow-up: [facility] pressures at [facility] 120-130/60's-70's. Denies dizziness, headaches, chest pain. Denies any muscle cramps or spasms. Denies side effects of medications. He is no longer using his Nordictrak; he moved the TV so he could watching while using it, but hasn't started yet.  No exercise (just shoveling snow this week).  Hyperlipidemia follow-up: Patient is reportedly following a low-fat, low cholesterol diet. Compliant with medications and denies medication side effects. Compliant with taking atorvastatin 79m daily. Admits that he just "eats too much".  Diabetes--he is trying to limit his carbs.  He hasn't checked is sugar in about a month.  He had checked them for a while, but can't recall the values.  Last eye exam was shortly after his last visit here.  Gained 11# in the last 3 months, between Thanksgiving and now.  He relates this to over-eating and lack of exercise. [place], and then the holidays.  Has seen nutritionist twice, but can't seem to follow the recommendations longterm.  Stopped taking aleve/aleve PM about 10 days ago, had some discomfort last night.  Anxiety:  Has been doing very well on the paxil.  He inadvertantly left it out of the pill box for 2 weeks and felt jittery, had recurrent anxiety and worsened moods.  He is back on the paxil and feeling back to his normal baseline.  Moods are improved.  PMH, PSH, SH reviewed/updated  Outpatient Encounter Prescriptions as of 08/26/2015  Medication Sig Note  . amLODipine (NORVASC) 10 MG tablet TAKE 1 TABLET (10 MG TOTAL) BY MOUTH DAILY.   .Marland Kitchenaspirin EC 81 MG tablet Take 81 mg by mouth daily.   .Marland Kitchenatenolol-chlorthalidone (TENORETIC) 50-25 MG tablet TAKE 1 TABLET EVERY  DAY   . atorvastatin (LIPITOR) 40 MG tablet Take 1 tablet (40 mg total) by mouth daily.   . Blood Glucose Monitoring Suppl (ACCU-CHEK AVIVA PLUS) W/DEVICE KIT 1 each by Does not apply route 2 (two) times daily. 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 glucose blood (ACCU-CHEK AVIVA PLUS) test strip Test twice daily   . LANCETS ULTRA THIN 30G MISC 1 each by Does not apply route 2 (two) times daily. E11.9-DX test twice daily   . Multiple Vitamins-Minerals (PRESERVISION AREDS PO) Take 2 tablets by mouth daily.   . Omega-3 Fatty Acids (FISH OIL PO) Take 1 capsule by mouth daily.   .Marland KitchenPARoxetine (PAXIL) 40 MG tablet TAKE 1 TABLET EVERY MORNING   . potassium chloride SA (K-DUR,KLOR-CON) 20 MEQ tablet TAKE 2 TABLET EVERY DAY   . Propylene Glycol (SYSTANE BALANCE OP) Apply 2 drops to eye daily. 1 drop in each eye once a day.   . thiamine (VITAMIN B-1) 100 MG tablet Take 100 mg by mouth daily.   . Triamcinolone Acetonide (NASACORT ALLERGY 24HR NA) Place 1 spray into the nose. Reported on 08/26/2015 05/07/2014: Uses as needed for allergies  . clobetasol cream (TEMOVATE) 08.82% Apply 1 application topically daily. Reported on 08/26/2015   . metroNIDAZOLE (METROCREAM) 0.75 % cream Apply 1 application topically at bedtime. Reported on 08/26/2015 08/06/2014: Uses prn with flares  . Naproxen Sod-Diphenhydramine (ALEVE PM PO) Take 1 tablet by mouth daily. Reported on 08/26/2015   . [DISCONTINUED] naproxen sodium (ANAPROX) 220  MG tablet Take 220 mg by mouth 2 (two) times daily with a meal.    No facility-administered encounter medications on file as of 08/26/2015.   Allergies  Allergen Reactions  . Betadine [Povidone Iodine] Rash   ROS: no fever, chills, URI symptoms, cough, shortness of breath, chest pain, palpitations, numbness, tingling, depression, anxiety, GI or GU complaints. See HPI  PHYSICAL EXAM: BP 130/76 mmHg  Pulse 60  Ht _0  (1.676 m)  Wt 250 lb (113.399 kg)  BMI 40.37 kg/m2  Well developed,  pleasant male in no distress. +abdominal obesity Alert and oriented, normal cranial nerves, gait Psych: normal mood, affect, hygiene and grooming   Lab Results  Component Value Date   HGBA1C 6.1 08/26/2015    ASSESSMENT/PLAN:  Diabetes mellitus without complication (HCC) - borderline, not on meds; increased in A1c; counseled extensively re: daily exercise, portions, healthy diet and weight loss - Plan: HgB A1c  Essential hypertension, benign - controlled  OSA (obstructive sleep apnea) - continue use of oral device.  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 Return in 3 months for med check Encouraged weight watchers or journaling to help with accountability

## 2015-08-26 NOTE — Patient Instructions (Signed)
Time to get back to your regular exercise and healthy eating habits. Consider signing up for Weight Watchers, to help keep you accountable.  You've seen the nutritionist and know what you are supposed to do, now it is time to do it.  Make the disconnect of desire, craving and what your body actually needs and portion control.  Food is FUEL for your body.  Start at least 30 minutes of exercise daily, even if you can only do 5-10 minute intervals at first, still get 30 minutes per day.  Continue the same medications. Follow up in 3 months.

## 2015-10-22 ENCOUNTER — Telehealth: Payer: Self-pay | Admitting: Family Medicine

## 2015-10-22 DIAGNOSIS — E876 Hypokalemia: Secondary | ICD-10-CM

## 2015-10-22 DIAGNOSIS — E7800 Pure hypercholesterolemia, unspecified: Secondary | ICD-10-CM

## 2015-10-22 DIAGNOSIS — E119 Type 2 diabetes mellitus without complications: Secondary | ICD-10-CM

## 2015-10-22 NOTE — Telephone Encounter (Signed)
He has med check in April. Okay for these rx's

## 2015-10-22 NOTE — Telephone Encounter (Signed)
Pt needs the following medications to be filled at new pharmacy [facility] mail order because of his new insurance.  He needs Amlodipine, Atenolol-chlorthalidone, Atorvastatin, Paroxetine, Potassium Chloride 90 days each

## 2015-10-25 MED ORDER — POTASSIUM CHLORIDE CRYS ER 20 MEQ PO TBCR: EXTENDED_RELEASE_TABLET | ORAL | Status: DC

## 2015-10-25 MED ORDER — ATORVASTATIN CALCIUM 40 MG PO TABS: 40.0000 mg | ORAL_TABLET | Freq: Every day | ORAL | Status: DC

## 2015-10-25 MED ORDER — AMLODIPINE BESYLATE 10 MG PO TABS: ORAL_TABLET | ORAL | Status: DC

## 2015-10-25 MED ORDER — PAROXETINE HCL 40 MG PO TABS: 40.0000 mg | ORAL_TABLET | Freq: Every morning | ORAL | Status: DC

## 2015-10-25 MED ORDER — ATENOLOL-CHLORTHALIDONE 50-25 MG PO TABS: 1.0000 | ORAL_TABLET | Freq: Every day | ORAL | Status: DC

## 2015-10-25 NOTE — Telephone Encounter (Signed)
rx's sent for 90 days each.

## 2015-11-17 ENCOUNTER — Encounter: Payer: Self-pay | Admitting: Family Medicine

## 2015-11-17 ENCOUNTER — Ambulatory Visit (INDEPENDENT_AMBULATORY_CARE_PROVIDER_SITE_OTHER): Payer: PPO | Admitting: Family Medicine

## 2015-11-17 VITALS — BP 124/74 | HR 60 | Ht 65.0 in | Wt 235.0 lb

## 2015-11-17 DIAGNOSIS — E7800 Pure hypercholesterolemia, unspecified: Secondary | ICD-10-CM

## 2015-11-17 DIAGNOSIS — I10 Essential (primary) hypertension: Secondary | ICD-10-CM

## 2015-11-17 DIAGNOSIS — E119 Type 2 diabetes mellitus without complications: Secondary | ICD-10-CM | POA: Diagnosis not present

## 2015-11-17 DIAGNOSIS — Z0000 Encounter for general adult medical examination without abnormal findings: Secondary | ICD-10-CM | POA: Diagnosis not present

## 2015-11-17 DIAGNOSIS — G4733 Obstructive sleep apnea (adult) (pediatric): Secondary | ICD-10-CM

## 2015-11-17 DIAGNOSIS — F325 Major depressive disorder, single episode, in full remission: Secondary | ICD-10-CM | POA: Diagnosis not present

## 2015-11-17 DIAGNOSIS — Z125 Encounter for screening for malignant neoplasm of prostate: Secondary | ICD-10-CM

## 2015-11-17 DIAGNOSIS — F1021 Alcohol dependence, in remission: Secondary | ICD-10-CM

## 2015-11-17 DIAGNOSIS — Z1159 Encounter for screening for other viral diseases: Secondary | ICD-10-CM | POA: Diagnosis not present

## 2015-11-17 DIAGNOSIS — Z5181 Encounter for therapeutic drug level monitoring: Secondary | ICD-10-CM | POA: Diagnosis not present

## 2015-11-17 LAB — POCT URINALYSIS DIPSTICK
BILIRUBIN UA: NEGATIVE
Blood, UA: NEGATIVE
GLUCOSE UA: NEGATIVE
KETONES UA: NEGATIVE
LEUKOCYTES UA: NEGATIVE
Nitrite, UA: NEGATIVE
PH UA: 7
Protein, UA: NEGATIVE
Spec Grav, UA: 1.02
Urobilinogen, UA: NEGATIVE

## 2015-11-17 LAB — CBC WITH DIFFERENTIAL/PLATELET
BASOS ABS: 0 {cells}/uL (ref 0–200)
Basophils Relative: 0 %
EOS ABS: 258 {cells}/uL (ref 15–500)
Eosinophils Relative: 3 %
HCT: 45.2 % (ref 38.5–50.0)
HEMOGLOBIN: 15.3 g/dL (ref 13.2–17.1)
LYMPHS ABS: 2150 {cells}/uL (ref 850–3900)
Lymphocytes Relative: 25 %
MCH: 30.4 pg (ref 27.0–33.0)
MCHC: 33.8 g/dL (ref 32.0–36.0)
MCV: 89.7 fL (ref 80.0–100.0)
MPV: 9.3 fL (ref 7.5–12.5)
Monocytes Absolute: 602 cells/uL (ref 200–950)
Monocytes Relative: 7 %
NEUTROS ABS: 5590 {cells}/uL (ref 1500–7800)
Neutrophils Relative %: 65 %
Platelets: 178 10*3/uL (ref 140–400)
RBC: 5.04 MIL/uL (ref 4.20–5.80)
RDW: 14.9 % (ref 11.0–15.0)
WBC: 8.6 10*3/uL (ref 4.0–10.5)

## 2015-11-17 LAB — POCT GLYCOSYLATED HEMOGLOBIN (HGB A1C): HEMOGLOBIN A1C: 5.8

## 2015-11-17 NOTE — Progress Notes (Signed)
Chief Complaint  Patient presents with  . Medicare Wellness    AWV/CPE, fasting. Has had some phlegm in his thrat x 1w week, slight cough. No other concerns.    Shannon Stone is a 70 y.o. male who presents for annual physical, wellness visit and follow-up on chronic medical conditions.  He has the following concerns:  While in [place] recently, he developed some postnasal drainage, slight clear runny nose, slight cough.  Phlegm is creamy. No fever, chills, sinus pressure or shortness of breath.  +watery eyes.    Hypertension follow-up: Blood pressures are running 120's-130/60's-70's. Denies dizziness, headaches, chest pain. Denies any muscle cramps or spasms. Denies side effects of medications.   Hyperlipidemia follow-up: Patient is reportedly following a low-fat, low cholesterol diet. Compliant with medications and denies medication side effects. Compliant with taking atorvastatin 22m daily.   Diabetes--he is trying to limit his carbs, avoids sweets. Only checks sugars sporadically, not recently. He lost 15# since his last visit here 3 months ago.  He has been walking more, and cut out sweets.  Parks farther away.  Still not using [employer] yet, even though he has a new TV set up in front of it. Last eye exam was about 6 months ago.  Anxiety: Has been doing very well on the paxil. (in the past he inadvertantly left it out of the pill box for 2 weeks and felt jittery, had recurrent anxiety and worsened moods).  OSA: He used the oral appliance for 2 weeks, and felt pretty good.  He got off track and hasn't been using it recently. Denies morning headaches or significant unrefreshed sleep.  Alcoholism--he has been sober since [facility]. [place] active in [place] daily meetings, as well as volunteering at [facility] He also works at [employer]  Hypogonadism--He has never taken any testosterone replacement. He saw Dr. KSerita Butcherin  the past, testosterone was low once, but normal on repeat.  He has ED. He no longer sees the urologist. He has been getting up frequently at night to void, 3-5x/night, [place].  [facility] (he had switched over there after not getting much benefit from injections from Dr. BLenox Ahrno longer is seeing him. He completed PT;  He takes 1 Aleve twice daily which helps control the pain. He doesn't do any home exercises. Slight residual numbness in his right first and 2nd fingers. No longer having neck or right radicular UE symptoms.   Immunization History  Administered Date(s) Administered  . Influenza Split 05/02/2012, 04/24/2013, 05/06/2015  . Influenza, High Dose Seasonal PF 05/07/2014  . Pneumococcal Conjugate-13 05/07/2014  . Pneumococcal Polysaccharide-23 04/24/2013  . Td 08/15/2003  . Tdap 08/06/2013  . Zoster 11/12/2014   Last colonoscopy: 5/08, due again in 10 years  Last PSA: 04/2014 Dentist: twice yearly Ophtho: regular (has macular degeneration) Exercise: no regular exercise, just a little walking--has gotten easier since he lost some weight.  He recalls having an abnormal test when trying to donate blood--told Hepatitis C.  He had additional tests done and was told he didn't have Hepatitis C, but he still wasn't allowed to donate blood.  Other doctors caring for patient include: Ophtho: On [address], he can't recall the name ([facility]?) Urology: Dr. KSerita Butcher(not seen recently, but in past) [facility] (in [facility], not [facility]) [facility] Podiatrist: [facility]ulla [facility] (in the past) Dentist: Dr. Quincy Simmonds  Depression screen:  negative Fall Screen: 1 trip/fall in the yard without injury (almost fell off the roof once) Functional Status Screen: notable for mild short term memory concerns, unchanged from last year (forgets things before  writing them on grocery list), very intermittent.  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  End of Life Discussion:  Patient does not have a living will and medical power of attorney  Past Medical History  Diagnosis Date  . Complication of anesthesia     DIFFICULTY URINATING  . Hypertension   . 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 Anxiety   . Sleep apnea     (done at [facility]); central and obstructive; using device from dentist  . 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 Depression     and anxiety  . Esophageal reflux   . Urgency of urination   . Nocturia   . Impotence of organic origin     erectile dysfunction--has seen Dr. Serita Butcher  . Psoriasis   . Rosacea   . Dry eye   . Diabetes mellitus without complication ([place])     history of borderline DM/elevated fasting glucose.  . Arthritis     OA-knees, elbow  . Other and unspecified hyperlipidemia     h/o elevated cholesterol  . Elevated LFTs     (ETOH)fatty infiltration on u/s  . Diverticulosis     sigmoid(5/08)  . Neck pain     cervical radiculopathy-Dr. Sherley Bounds; persistent, s/p surgery  . Alcohol dependence ([place])     history of abuse/dependence-s/p inpatient rehab at [facility] 08/2008  . Macular degeneration   . Microscopic hematuria     eval by Dr. Serita Butcher (pt reports h/o kidney stones)  . 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 Periodic limb movements of sleep     per sleep study 11/02  . Plantar fasciitis     treated by Dr. Paulla Dolly (injection)  . Spinal stenosis in cervical region 03/03/14  . Brachial neuritis or radiculitis NOS 03/03/2014    Past Surgical History  Procedure Laterality Date  . Cervical spine surgery  2011, 2013    Dr. Sherley Bounds  . Knee surgery Left 2013  . Umbilical hernia repair      AS CHILD   . Posterior cervical fusion/foraminotomy  12/01/2011    Procedure: POSTERIOR CERVICAL  FUSION/FORAMINOTOMY LEVEL 1;  Surgeon: Eustace Moore, MD;  Location: [facility];  Service: Neurosurgery;  Laterality: Bilateral;  Cervical five-six posterior cervical fusion     Social History   Social History  . Marital Status: Single    Spouse Name: N/A  . Number of Children: 3  . Years of Education: N/A   Occupational History  . retired Midwife)    Social History Main Topics  . Smoking status: Former Smoker -- 1.00 packs/day for 18 years    Types: Cigarettes    Quit date: 08/14/1990  . Smokeless tobacco: Never Used  . Alcohol Use: Yes     Comment: zero drinks in 5 years.  sober since 08/26/2008  . Drug Use: No  . Sexual Activity: Not Currently   Other Topics Concern  . Not on file   Social History Narrative   Divorced.  Lives  alone, 1 cat.  Retired Conservation officer, nature.  Alcoholic--sober since 11/8544.  Active in AA, daily meetings, and he volunteers at [facility] (where he did rehab), detox at [facility] and at the jail.  2 daughters and a son (1 in [place], 1 in [place], [place], 1 in [place], [place]). 5 grandchildren    Family History  Problem Relation Age of Onset  . Arthritis Mother   . Hypertension Mother   . Alcohol abuse Mother   . CVA Mother   . Hypertension Father   . Asthma Father   . Dementia Father   . Transient ischemic attack Father 41  . Cancer Paternal Grandfather     stomach  . Hodgkin's lymphoma Sister     history of  . Heart disease Sister     arrhythmia (required cardioversion)--age 47  . Cancer Maternal Uncle     stomach  . Parkinson's disease Paternal Grandmother   . Diabetes Other     amputee    Outpatient Encounter Prescriptions as of 11/17/2015  Medication Sig Note  . amLODipine (NORVASC) 10 MG tablet TAKE 1 TABLET (10 MG TOTAL) BY MOUTH DAILY.   Marland Kitchen aspirin EC 81 MG tablet Take 81 mg by mouth daily.   Marland Kitchen atenolol-chlorthalidone (TENORETIC) 50-25 MG tablet Take 1 tablet by mouth daily.   Marland Kitchen atorvastatin (LIPITOR) 40 MG tablet Take 1  tablet (40 mg total) by mouth daily.   . Blood Glucose Monitoring Suppl (ACCU-CHEK AVIVA PLUS) W/DEVICE KIT 1 each by Does not apply route 2 (two) times daily. 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 budesonide (RHINOCORT ALLERGY) 32 MCG/ACT nasal spray Place into both nostrils daily.   Marland Kitchen glucose blood (ACCU-CHEK AVIVA PLUS) test strip Test twice daily   . LANCETS ULTRA THIN 30G MISC 1 each by Does not apply route 2 (two) times daily. E11.9-DX test twice daily   . Multiple Vitamins-Minerals (PRESERVISION AREDS PO) Take 2 tablets by mouth daily.   . Naproxen Sod-Diphenhydramine (ALEVE PM PO) Take 1 tablet by mouth daily. Reported on 08/26/2015 11/17/2015: Takes on every evening  . Naproxen Sodium (ALEVE PO) Take 1 tablet by mouth daily. 11/17/2015: Takes 1 every morning  . Omega-3 Fatty Acids (FISH OIL PO) Take 1 capsule by mouth daily.   Marland Kitchen PARoxetine (PAXIL) 40 MG tablet Take 1 tablet (40 mg total) by mouth every morning.   . potassium chloride SA (K-DUR,KLOR-CON) 20 MEQ tablet TAKE 2 TABLET EVERY DAY   . Propylene Glycol (SYSTANE BALANCE OP) Apply 2 drops to eye daily. 1 drop in each eye once a day.   . thiamine (VITAMIN B-1) 100 MG tablet Take 100 mg by mouth daily.   . clobetasol cream (TEMOVATE) 2.70 % Apply 1 application topically daily. Reported on 11/17/2015   . metroNIDAZOLE (METROCREAM) 0.75 % cream Apply 1 application topically at bedtime. Reported on 11/17/2015 08/06/2014: Uses prn with flares  . [DISCONTINUED] Triamcinolone Acetonide (NASACORT ALLERGY 24HR NA) Place 1 spray into the nose. Reported on 08/26/2015 05/07/2014: Uses as needed for allergies   No facility-administered encounter medications on file as of 11/17/2015.    Allergies  Allergen Reactions  . Betadine [Povidone Iodine] Rash    ROS: The patient denies anorexia, fever, headaches, hoarseness, chest pain, palpitations, dizziness, syncope, dyspnea on exertion, cough, swelling, no melena, hematochezia, indigestion/heartburn,  hematuria, dysuria, genital lesions, weakness, tremor, suspicious skin lesions (some rashes and dry skin related to psoriasis; no rosacea flares), abnormal bleeding/bruising, or enlarged lymph nodes. No edema. Moods  are good overall. +Hearing loss in R ear. Has seen ENT in the past; he had planned to get hearing aid, but hasn't yet due to cost Some pain in left lower back with prolonged walking, although no problems recently when walking in [place]. Some tingling in in the RUE and first and second fingers. 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) Occasional nosebleeds, dry skin Chronic decreased libido  Chronic neck/RUE pain relieved with BID naproxen Some short-term memory concerns--these are intermittent, not progressive  15# weight loss since last visit 3 months ago  PHYSICAL EXAM:  BP 124/74 mmHg  Pulse 60  Ht '5\' 5"'  (1.651 m)  Wt 235 lb (106.595 kg)  BMI 39.11 kg/m2   General Appearance:   Alert, cooperative, no distress, appears stated age  Head:   Normocephalic, without obvious abnormality, atraumatic  Eyes:   PERRL, conjunctiva/corneas clear, EOM's intact, fundi   benign  Ears:   Normal TM's and external ear canals  Nose:  Nares normal, mucosa is mildly edematous with some clear mucus noted; no sinus tenderness  Throat:  Lips, mucosa, and tongue normal; teeth and gums normal  Neck:  Supple, no lymphadenopathy; thyroid: no enlargement/tenderness/nodules; no carotid  bruit or JVD  Back:  Spine nontender, no curvature, ROM normal, no CVA tenderness  Lungs:   Clear to auscultation bilaterally without wheezes, rales or ronchi; respirations unlabored  Chest Wall:   No tenderness or deformity  Heart:   Regular rate and rhythm, S1 and S2 normal, no murmur, rub  or gallop  Breast Exam:   No chest wall tenderness, or  masses  Abdomen:   Soft, non-tender, nondistended, active bowel sounds,   no masses, no hepatosplenomegaly. +abdominal obesity  Genitalia:   Normal male external genitalia without lesions. Testicles without masses, right larger than left. No inguinal hernias.  Rectal:  Patient refuses exam today  Extremities:  No clubbing, cyanosis or edema  Pulses:  2+ and symmetric all extremities  Skin:  Skin color, texture, turgor normal; no rashes/lesions noted.  Lymph nodes:  Cervical, supraclavicular, and axillary nodes normal  Neurologic:  CNII-XII intact, normal strength, sensation and gait; reflexes 2+ and symmetric throughout   Psych: Normal mood, affect, hygiene and grooming             REFUSES RECTAL EXAM TODAY bc he "has gas"  Normal diabetic foot exam.  Some callous on the left heel.  Normal urine dip  Lab Results  Component Value Date   HGBA1C 5.8 11/17/2015   (down from 6.1 in January).    ASSESSMENT/PLAN:  Annual physical exam - Plan: Visual acuity screening, POCT Urinalysis Dipstick  Diabetes mellitus without complication ([place]) - improved; continue weight loss, increase exercise - Plan: HgB A1c, Comprehensive metabolic panel, TSH  Essential hypertension, benign - controlled - Plan: Comprehensive metabolic panel  OSA (obstructive sleep apnea) - encouraged compliance with oral device  Depression, major, in remission ([place]) - controlled; continue Paxil  Alcoholism in remission ([place])  Type 2 diabetes mellitus without complication, without long-term current use of insulin ([place])  Hypercholesteremia - Plan: Lipid panel  Medicare annual wellness visit, subsequent  Medication monitoring encounter - Plan: Lipid panel, Comprehensive metabolic panel, TSH, CBC with Differential/Platelet  Screening for prostate cancer - Plan: PSA, Medicare  Need for hepatitis C screening test - Plan: Hepatitis C  antibody   c-met, lipids, A1c,  PSA medicare, Hep C, TSH, CBC  Please take a medication such as Prilosec OTC or Nexium 24 hour (both are over the counter) once daily every day, to help protect your stomach from ulcers (due to taking Aleve chronically).  Nocturia: Please limit your fluid intake after 7pm.  Try and get your full 8 glasses of fluid earlier in the day. If you continue to have frequent voiding at night, you can consider trying saw palmetto. We will need to check your prostate at your next visit (since you declined the exam today).  Please be sure to get a DIABETIC EYE EXAM once yearly, and have them send [place] the report.  Medications all refilled recently; pharmacy to contact us when refills are needed in June.   Discussed PSA screening (risks/benefits), recommended at least 30 minutes of aerobic activity at least 5 days/week; proper sunscreen use reviewed; healthy diet and alcohol recommendations (less than or equal to 2 drinks/day) reviewed; regular seatbelt use; changing batteries in smoke detectors. Self-testicular exams. Immunization recommendations discussed-- UTD, continue yearly high dose flu shots.Colonoscopy recommendations reviewed--it sounds as though he is due 12/2016; given hemasure kit.  MOST form reviewed and updated. Given forms for Living Will and Healthcare power of attorney, and encouraged him to return a copy to [place] once completed.   Will need urine microalb in July   Medicare Attestation I have personally reviewed: The patient's medical and social history Their use of alcohol, tobacco or illicit drugs Their current medications and supplements The patient's functional ability including ADLs,fall risks, home safety risks, cognitive, and hearing and visual impairment Diet and physical activities Evidence for depression or mood disorders  The patient's weight, height, BMI, and visual acuity have been recorded in the chart.  I have made referrals,  counseling, and provided education to the patient based on review of the above and I have provided the patient with a written personalized care plan for preventive services.     Quanasia Defino A, MD   11/17/2015

## 2015-11-17 NOTE — Patient Instructions (Addendum)
HEALTH MAINTENANCE RECOMMENDATIONS:  It is recommended that you get at least 30 minutes of aerobic exercise at least 5 days/week (for weight loss, you may need as much as 60-90 minutes). This can be any activity that gets your heart rate up. This can be divided in 10-15 minute intervals if needed, but try and build up your endurance at least once a week.  Weight bearing exercise is also recommended twice weekly.  Eat a healthy diet with lots of vegetables, fruits and fiber.  "Colorful" foods have a lot of vitamins (ie green vegetables, tomatoes, red peppers, etc).  Limit sweet tea, regular sodas and alcoholic beverages, all of which has a lot of calories and sugar.  Up to 2 alcoholic drinks daily may be beneficial for men (unless trying to lose weight, watch sugars).  Drink a lot of water.  Sunscreen of at least SPF 30 should be used on all sun-exposed parts of the skin when outside between the hours of 10 am and 4 pm (not just when at beach or pool, but even with exercise, golf, tennis, and yard work!)  Use a sunscreen that says "broad spectrum" so it covers both UVA and UVB rays, and make sure to reapply every 1-2 hours.  Remember to change the batteries in your smoke detectors when changing your clock times in the spring and fall.  Use your seat belt every time you are in a car, and please drive safely and not be distracted with cell phones and texting while driving.     Shannon Stone , Thank you for taking time to come for your Medicare Wellness Visit. I appreciate your ongoing commitment to your health goals. Please review the following plan we discussed and let me know if I can assist you in the future.   These are the goals we discussed: Goals    None      This is a list of the screening recommended for you and due dates:  Health Maintenance  Topic Date Due  .  Hepatitis C: One time screening is recommended by Center for Disease Control  (CDC) for  adults born from [birth year] through 1964.    09-30-1945  . Eye exam for diabetics  [birth year]/1957  . Colon Cancer Screening  05/23/1997  . Complete foot exam   05/08/2015  . Urine Protein Check  02/17/2016  . Hemoglobin A1C  02/23/2016  . Flu Shot  03/14/2016  . Tetanus Vaccine  08/07/2023  . Shingles Vaccine  Completed  . Pneumonia vaccines  Completed    Hepatitis C screening is being done today Yearly diabetic eye exams are needed--please be sure that the eye doctor sends [place] the report Your diabetic foot exam was done today We are doing the yearly urine protein check today (since you gave a sample) I believe you are due for colon cancer screening in 2018 (we don't have records of your last one, so the date above was that you were due at age 11).  Please take a medication such as Prilosec OTC or Nexium 24 hour (both are over the counter) once daily every day, to help protect your stomach from ulcers (due to taking Aleve chronically).  Please limit your fluid intake after 7pm.  Try and get your full 8 glasses of fluid earlier in the day. If you continue to have frequent voiding at night, you can consider trying saw palmetto. We will need to check your prostate at your next visit (since you declined the exam today).  Please be sure to get a DIABETIC EYE EXAM once yearly, and have them send [place] the report.  Please discuss with your family your wishes, and fill out the Living Will and healthcare power of attorney.  Please give [place] a copy at your convenience.

## 2015-11-18 LAB — COMPREHENSIVE METABOLIC PANEL
ALBUMIN: 4.2 g/dL (ref 3.6–5.1)
ALT: 36 U/L (ref 9–46)
AST: 19 U/L (ref 10–35)
Alkaline Phosphatase: 78 U/L (ref 40–115)
BILIRUBIN TOTAL: 0.9 mg/dL (ref 0.2–1.2)
BUN: 13 mg/dL (ref 7–25)
CO2: 27 mmol/L (ref 20–31)
CREATININE: 0.61 mg/dL — AB (ref 0.70–1.25)
Calcium: 9.6 mg/dL (ref 8.6–10.3)
Chloride: 101 mmol/L (ref 98–110)
GLUCOSE: 99 mg/dL (ref 65–99)
Potassium: 4.1 mmol/L (ref 3.5–5.3)
SODIUM: 140 mmol/L (ref 135–146)
Total Protein: 6.8 g/dL (ref 6.1–8.1)

## 2015-11-18 LAB — LIPID PANEL
CHOL/HDL RATIO: 2.5 ratio (ref <=5.0)
CHOLESTEROL: 87 mg/dL — AB (ref 125–200)
HDL: 35 mg/dL — AB (ref >=40)
LDL Cholesterol: 37 mg/dL (ref <130)
TRIGLYCERIDES: 73 mg/dL (ref <150)
VLDL: 15 mg/dL (ref <30)

## 2015-11-18 LAB — PSA, MEDICARE: PSA: 3.32 ng/mL (ref <=4.00)

## 2015-11-18 LAB — TSH: TSH: 1.71 mIU/L (ref 0.40–4.50)

## 2015-11-18 LAB — HEPATITIS C ANTIBODY: HCV Ab: NEGATIVE

## 2015-11-24 ENCOUNTER — Other Ambulatory Visit: Payer: PPO

## 2015-11-24 ENCOUNTER — Encounter: Payer: Medicare Other | Admitting: Family Medicine

## 2015-11-24 DIAGNOSIS — Z0000 Encounter for general adult medical examination without abnormal findings: Secondary | ICD-10-CM

## 2015-11-25 LAB — FECAL OCCULT BLOOD, IMMUNOCHEMICAL: Fecal Occult Blood: NEGATIVE

## 2016-02-07 ENCOUNTER — Telehealth: Payer: Self-pay | Admitting: Family Medicine

## 2016-02-07 NOTE — Telephone Encounter (Signed)
Rcd voice mail from pt.  Returned call to patient.  Received his voice mail [email or web address]

## 2016-02-17 ENCOUNTER — Other Ambulatory Visit: Payer: Self-pay | Admitting: Family Medicine

## 2016-02-17 ENCOUNTER — Telehealth: Payer: Self-pay | Admitting: *Deleted

## 2016-02-17 NOTE — Telephone Encounter (Signed)
Left message for pt to call.

## 2016-02-17 NOTE — Telephone Encounter (Signed)
nonfasting is fine

## 2016-02-17 NOTE — Telephone Encounter (Signed)
Refill requests came in today, I called patient to see if he needed before next Thursday's appt and he does-so I sent. He wanted to make sure he did not need to be fasting next week, I told him I was pretty sure he did not need to be-just wanted to double check with you.

## 2016-02-24 ENCOUNTER — Encounter: Payer: Self-pay | Admitting: Family Medicine

## 2016-02-24 ENCOUNTER — Ambulatory Visit (INDEPENDENT_AMBULATORY_CARE_PROVIDER_SITE_OTHER): Payer: PPO | Admitting: Family Medicine

## 2016-02-24 VITALS — BP 138/68 | HR 60 | Ht 65.0 in | Wt 245.8 lb

## 2016-02-24 DIAGNOSIS — F1021 Alcohol dependence, in remission: Secondary | ICD-10-CM | POA: Diagnosis not present

## 2016-02-24 DIAGNOSIS — E119 Type 2 diabetes mellitus without complications: Secondary | ICD-10-CM

## 2016-02-24 DIAGNOSIS — M5010 Cervical disc disorder with radiculopathy, unspecified cervical region: Secondary | ICD-10-CM | POA: Diagnosis not present

## 2016-02-24 DIAGNOSIS — I10 Essential (primary) hypertension: Secondary | ICD-10-CM | POA: Diagnosis not present

## 2016-02-24 LAB — POCT GLYCOSYLATED HEMOGLOBIN (HGB A1C): Hemoglobin A1C: 6.2

## 2016-02-24 NOTE — Patient Instructions (Signed)
  Please look into exercise programs such as Silver Sneakers. If that is 3x/week, you need to be doing something on your own for at least 30 minutes 2-3 of the other days of the week. Stop buying sweets--spend in the money on healthy fruits and vegetables. You have an issue with self control for the sweets, so not having them around the house is truly the best option for you. Your blood pressure is high today.  Daily exercise, low sodium diet and weight loss should get these numbers back down.  Goal <120-130/70's. Your blood sugars are also higher than they were 3 months ago. Checking the sugars periodically might help you keep an eye on things, especially in regard to your sugar intake.  Stop taking Aleve twice daily due to risks to your kidneys and stomach.  Instead, try taking Tylenol Arthritis, and using the Aleve only if needed for pain not controlled by the Tylenol.  Don't exceed the recommended amounts of tylenol. I recommend that you follow up with the neurosurgeon regarding your neck and right arm pain.  You might benefit from another injection.

## 2016-02-24 NOTE — Progress Notes (Signed)
Chief Complaint  Patient presents with  . Diabetes    nonfasting med check.    He admits that the weight is starting to be uncomfortable for him--hard to put his shoes on now.  He has gained 10# since his last visit 3 months ago (regained much of the weight he had lost).  His moods were down for a bit, improved after talking with his children on Father's Day.  Planning to see his [place] in [place] in 3 months, wants to lose weight by then. Feels lonely sometimes.  Volunteers at [employer], at [employer], goes to [employer]. But he also spends a lot of time at home, snacking in the kitchen.  He eats a lot of cheese, sometimes will buy cookies, which he finishes quickly. Admits to having a sweet tooth.  Hypertension follow-up: Blood pressures are running 130-140/70's-80, sometimes down to 115/60. Denies dizziness, headaches, chest pain. Denies any muscle cramps or spasms. Denies side effects of medications. Tries to follow a low sodium diet.  Has not been getting any exercise.  DM--hasn't been checking his sugars regularly.  He is taking Aleve twice daily for his right shoulder pain (cervical radiculopathy) He continues to have neck pain, and pain radiating down the right arm.  He doesn't want any more surgeries, so he started taking Aleve. He has done PT, didn't help and not interested in more therapy. He has also had injections, worked for about a month, pain recurred.  He hasn't had this in over 4 years.  PMH, PSH, SH reviewed  Outpatient Encounter Prescriptions as of 02/24/2016  Medication Sig Note  . amLODipine (NORVASC) 10 MG tablet Take 1 tablet by mouth daily   . aspirin EC 81 MG tablet Take 81 mg by mouth daily.   Marland Kitchen atenolol-chlorthalidone (TENORETIC) 50-25 MG tablet Take 1 tablet by mouth daily   . atorvastatin (LIPITOR) 40 MG tablet Take 1 tablet by mouth daily   . Blood Glucose Monitoring Suppl (ACCU-CHEK AVIVA PLUS) W/DEVICE KIT 1 each by Does not apply route 2 (two) times daily. DX-E11.9  patient is to test BID   . budesonide (RHINOCORT ALLERGY) 32 MCG/ACT nasal spray Place into both nostrils daily.   Marland Kitchen glucose blood (ACCU-CHEK AVIVA PLUS) test strip Test twice daily   . LANCETS ULTRA THIN 30G MISC 1 each by Does not apply route 2 (two) times daily. E11.9-DX test twice daily   . Multiple Vitamins-Minerals (PRESERVISION AREDS PO) Take 2 tablets by mouth daily.   . Naproxen Sod-Diphenhydramine (ALEVE PM PO) Take 1 tablet by mouth daily. Reported on 08/26/2015 11/17/2015: Takes on every evening  . Naproxen Sodium (ALEVE PO) Take 1 tablet by mouth daily. 11/17/2015: Takes 1 every morning  . Omega-3 Fatty Acids (FISH OIL PO) Take 1 capsule by mouth daily.   Marland Kitchen PARoxetine (PAXIL) 40 MG tablet Take 1 tablet by mouth every morning   . potassium chloride SA (K-DUR,KLOR-CON) 20 MEQ tablet Take 2 tablets by mouth every day   . Propylene Glycol (SYSTANE BALANCE OP) Apply 2 drops to eye daily. 1 drop in each eye once a day.   . thiamine (VITAMIN B-1) 100 MG tablet Take 100 mg by mouth daily.   . clobetasol cream (TEMOVATE) 7.00 % Apply 1 application topically daily. Reported on 02/24/2016   . metroNIDAZOLE (METROCREAM) 0.75 % cream Apply 1 application topically at bedtime. Reported on 02/24/2016 08/06/2014: Uses prn with flares   No facility-administered encounter medications on file as of 02/24/2016.   Allergies  Allergen Reactions  . Betadine [Povidone Iodine] Rash   ROS: no fever, chills, URI symptoms, headaches, dizziness, chest pain, nausea, vomiting, bowel changes, urinary complaints; currently denies depression, anxiety. Denies bleeding, bruising, rash. +right shoulder/arm pain.  PHYSICAL EXAM: BP 150/80 mmHg  Pulse 60  Ht '5\' 5"'  (1.651 m)  Wt 245 lb 12.8 oz (111.494 kg)  BMI 40.90 kg/m2 138/68 Pleasant, obese male in no distress HEENT: PERRL, EOMI, conjunctiva and sclera are clear Neck: c-spine nontender, no significant muscle spasm palpable. No lymphadenopathy, thyromegaly or  mass Heart: regular rate and rhythm Lungs: clear bilaterally Extremities: no edema  Declines prostate exam today (refused at last visit/CPE) Agreeable to do at next visit ("I promise!")   Lab Results  Component Value Date   HGBA1C 6.2 02/24/2016   ASSESSMENT/PLAN:  Diabetes mellitus without complication (HCC) - K8J higher, still diet-controlled. Encouraged exercise, weight loss, and avoidance of sweets - Plan: HgB A1c, Microalbumin / creatinine urine ratio  Essential hypertension, benign - higher than in past (ok on repeat); daily exercise, weight loss, low sodium diet   Type 2 diabetes mellitus without complication, without long-term current use of insulin (HCC)  Morbid obesity, unspecified obesity type ([place]) - counseled extensively, tried to motivate him; risks of obesity reviewed. diet/exercise discussed in detail  Alcoholism in remission [facility])  Cervical disc disorder with radiculopathy of cervical region - f/u with neurosurgeon; declines PT. May benefit from another injection. risks of NSAIDs reviewed   Please look into exercise programs such as Silver Sneakers. If that is 3x/week, you need to be doing something on your own for at least 30 minutes 2-3 of the other days of the week. Stop buying sweets--spend in the money on healthy fruits and vegetables. You have an issue with self control for the sweets, so not having them around the house is truly the best option for you. Your blood pressure is high today.  Daily exercise, low sodium diet and weight loss should get these numbers back down.  Goal <120-130/70's. Your blood sugars are also higher than they were 3 months ago. Checking the sugars periodically might help you keep an eye on things, especially in regard to your sugar intake.  Stop taking Aleve twice daily due to risks to your kidneys and stomach.  Instead, try taking Tylenol Arthritis, and using the Aleve only if needed for pain not controlled by the Tylenol.  Don't  exceed the recommended amounts of tylenol. I recommend that you follow up with the neurosurgeon regarding your neck and right arm pain.  You might benefit from another injection.   30 min visit, more than 1/2 spent counseling.  3 month f/u--WILL HAVE PROSTATE EXAM THEN

## 2016-02-25 ENCOUNTER — Encounter: Payer: Self-pay | Admitting: Family Medicine

## 2016-02-25 LAB — MICROALBUMIN / CREATININE URINE RATIO
CREATININE, URINE: 145 mg/dL (ref 20–370)
MICROALB/CREAT RATIO: 6 ug/mg{creat} (ref <30)
Microalb, Ur: 0.8 mg/dL

## 2016-02-28 ENCOUNTER — Encounter: Payer: Medicare Other | Admitting: Family Medicine

## 2016-05-01 ENCOUNTER — Encounter: Payer: Self-pay | Admitting: Family Medicine

## 2016-05-01 ENCOUNTER — Ambulatory Visit (INDEPENDENT_AMBULATORY_CARE_PROVIDER_SITE_OTHER): Payer: PPO | Admitting: Family Medicine

## 2016-05-01 VITALS — BP 118/64 | HR 64 | Ht 65.0 in | Wt 236.0 lb

## 2016-05-01 DIAGNOSIS — L853 Xerosis cutis: Secondary | ICD-10-CM | POA: Diagnosis not present

## 2016-05-01 DIAGNOSIS — L989 Disorder of the skin and subcutaneous tissue, unspecified: Secondary | ICD-10-CM

## 2016-05-01 DIAGNOSIS — E119 Type 2 diabetes mellitus without complications: Secondary | ICD-10-CM

## 2016-05-01 DIAGNOSIS — R234 Changes in skin texture: Secondary | ICD-10-CM

## 2016-05-01 NOTE — Patient Instructions (Signed)
  At bedtime, use vaseline to the affected areas of the heels, and put socks on. Pumice stone after bathing/soaking (when thickened skin is softer). Given that you are a diabetic, we don't want you to be too aggressive in your own foot care.  We are referring you to [facility], who will assist in resolving this problem.

## 2016-05-01 NOTE — Progress Notes (Signed)
Chief Complaint  Patient presents with  . Cracked heels    dry, cracked heels x 6 months.    Dry, cracking heels for a few months.  It sometimes breaks open and bleeds.  He has tried using "heel balm" once daily, along with pedi-egg, and has gotten a lot of skin off. It is starting to feel sore.  He has diet controlled diabetes.  Denies numbness/tingling or other foot issues.   Lost weight--cut out sweets in his diet, but still not exercising regularly.  PMH, PSH, SH reviewed  Meds and allergies reviewed.  ROS:  No fever, chills, numbness, tingling, URI symptoms, chest pain, shortness of breath, bleeding, bruising, rash. Intentional weight loss, normal appetite. No GI or other complaints  PHYSICAL EXAM:  BP 118/64 (BP Location: Left Arm, Patient Position: Sitting, Cuff Size: Normal)   Pulse 64   Ht 5\' 5"  (1.651 m)   Wt 236 lb (107 kg)   BMI 39.27 kg/m   Well appearing, pleasant male with abdominal obesity.  Extremities:    Posterior heels and medially aspect of both heels are thickened, dry with fissures.  No drainage, erythema, soft tissue swelling Normal pulses Normal skin on the rest of the feet, between toes.   ASSESSMENT/PLAN:  Dry skin - Plan: Ambulatory referral to Podiatry  Fissure in skin of foot - Plan: Ambulatory referral to Podiatry  Type 2 diabetes mellitus without complication, without long-term current use of insulin ([place]) - Plan: Ambulatory referral to Podiatry   Refer to podiatry   At bedtime, use vaseline to the affected areas of the heels, and put socks on. Pumice stone after bathing/soaking (when thickened skin is softer). Given that you are a diabetic, we don't want you to be too aggressive in your own foot care.  We are referring you to [facility], who will assist in resolving this problem.  Got flu shot at [facility]

## 2016-05-08 ENCOUNTER — Encounter: Payer: Self-pay | Admitting: Podiatry

## 2016-05-08 ENCOUNTER — Ambulatory Visit (INDEPENDENT_AMBULATORY_CARE_PROVIDER_SITE_OTHER): Payer: PPO | Admitting: Podiatry

## 2016-05-08 VITALS — BP 135/63 | HR 47 | Resp 16 | Ht 66.0 in | Wt 230.0 lb

## 2016-05-08 DIAGNOSIS — L309 Dermatitis, unspecified: Secondary | ICD-10-CM

## 2016-05-08 NOTE — Progress Notes (Signed)
   Subjective:    Patient ID: Shannon Stone, male    DOB: 1946/06/23, 70 y.o.   MRN: [id number]  HPI Chief Complaint  Patient presents with  . Foot Pain    bilateral / skin cracking x "about a yr and continues to get worse". Pt states he is "borderline diabetic but does not check sugars.       Review of Systems  HENT: Positive for congestion, hearing loss and tinnitus.   Musculoskeletal: Positive for arthralgias.  Neurological: Positive for numbness.  All other systems reviewed and are negative.      Objective:   Physical Exam        Assessment & Plan:

## 2016-05-09 NOTE — Progress Notes (Signed)
Subjective:     Patient ID: Shannon Stone, male   DOB: Oct 28, 1945, 70 y.o.   MRN: [id number]  HPI patient presents stating I have severe callus formation on my heels that I wanted checked and I have diabetes that's under reasonably good control but at times gives me trouble   Review of Systems  All other systems reviewed and are negative.      Objective:   Physical Exam  Constitutional: He is oriented to person, place, and time.  Cardiovascular: Intact distal pulses.   Musculoskeletal: Normal range of motion.  Neurological: He is oriented to person, place, and time.  Skin: Skin is warm and dry.  Nursing note and vitals reviewed.  neurovascular status intact muscle strength was found to be adequate with mild diminishment of neurological sensation. Patient's found to have good DTR reflexes and is found to have significant callus tissue plantar medial aspect of the heel bilateral with fissuring but no open areas or redness or drainage. Patient's found have good digital perfusion is well oriented 3     Assessment:     Crack skin secondary to abnormal skin conditions with possible diabetes as, getting factor    Plan:     H&P performed conditions reviewed and recommended debridement of tissue to be followed by Vaseline under occlusion and utilizing of revitaderm. Reappoint in the next 4-6 months or any breaks in skin should occur

## 2016-05-13 ENCOUNTER — Other Ambulatory Visit: Payer: Self-pay | Admitting: Family Medicine

## 2016-05-21 NOTE — Progress Notes (Signed)
Chief Complaint  Patient presents with  . Diabetes    fasting med check.    He leaves today to go to [place], [place] for a week to visit his daughter.  His son has been visiting him from [place] the last few days. He has been "entertaining", eating out more in restaurants, and thinks he gained a little weight since his son has been visiting.  Hypertension follow-up: He hasn't been checking his blood pressure at home. Every once in a while he will check it at the pharmacy, and it is okay.  Denies dizziness, headaches, chest pain. Denies any muscle cramps or spasms. Denies side effects of medications. Tries to follow a low sodium diet.   DM--hasn't been checking his sugars regularly. He has the equipment, but isn't checking. Last A1c prior to today's visit: Lab Results  Component Value Date   HGBA1C 6.2 02/24/2016   Obesity--he cut out sweets and has lost some weight since his July visit. He hasn't started a regular exercise routine. He was asked to look into [employer] program, but hasn't yet done so.  Dry feet with fissures.  He saw podiatrist last month.  He feels like it has improved some since wearing sneakers rather than sandals. Denies any pain, redness.  Cervical radiculopathy--neck and right arm pain.  He only needs to take a medication once a week--[facility] any GI side effects, bleeding/bruising.  He is due to have his prostate/genital exam today.  This had been declined at his physical, and he had promised to have it done today.  He denies any urinary or rectal problems.  Every once in a while he gets a sharp, deep pain ("inside me"), not related to bowel movements.  If he holds his urine too long, it is hard to start; otherwise there is no hesitancy. Feels like his bladder empties well.  PMH, PWynot SH reviewed  Outpatient Encounter Prescriptions as of 05/22/2016  Medication Sig Note  . amLODipine (NORVASC) 10 MG  tablet Take 1 tablet by mouth daily   . aspirin EC 81 MG tablet Take 81 mg by mouth daily.   .Marland Kitchenatenolol-chlorthalidone (TENORETIC) 50-25 MG tablet Take 1 tablet by mouth daily   . Blood Glucose Monitoring Suppl (ACCU-CHEK AVIVA PLUS) W/DEVICE KIT 1 each by Does not apply route 2 (two) times daily. 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 budesonide (RHINOCORT ALLERGY) 32 MCG/ACT nasal spray Place into both nostrils daily.   . clobetasol cream (TEMOVATE) 07.25% Apply 1 application topically daily. Reported on 02/24/2016   . glucose blood (ACCU-CHEK AVIVA PLUS) test strip Test twice daily   . LANCETS ULTRA THIN 30G MISC 1 each by Does not apply route 2 (two) times daily. E11.9-DX test twice daily   . Multiple Vitamins-Minerals (PRESERVISION AREDS PO) Take 2 tablets by mouth daily.   . Omega-3 Fatty Acids (FISH OIL PO) Take 1 capsule by mouth daily.   .Marland KitchenPARoxetine (PAXIL) 40 MG tablet Take 1 tablet by mouth every morning   . potassium chloride SA (K-DUR,KLOR-CON) 20 MEQ tablet Take 2 tablets by mouth every day   . Propylene Glycol (SYSTANE BALANCE OP) Apply 2 drops to eye daily. 1 drop in each eye once a day.   . thiamine (VITAMIN B-1) 100 MG tablet Take 100 mg by mouth daily.   .Marland Kitchenatorvastatin (LIPITOR) 40 MG tablet Take 1 tablet by mouth daily   . metroNIDAZOLE (METROCREAM) 0.75 %  cream Apply 1 application topically at bedtime. Reported on 02/24/2016 08/06/2014: Uses prn with flares   No facility-administered encounter medications on file as of 05/22/2016.    Allergies  Allergen Reactions  . Betadine [Povidone Iodine] Rash    ROS: Denies fever, chills, URI symptoms, cough, shortness of breath, chest pain, palpitations.  Sometimes after eating he gets a slight cough after eating in the evenings.  Denies heartburn, dysphagia. Denies GI or GU symptoms, bleeding, bruising, rash or other complaints. See HPI.  PHYSICAL EXAM:  BP 128/70 (BP Location: Left Arm, Patient Position: Sitting, Cuff Size: Normal)    Pulse 68   Ht '5\' 6"'  (1.676 m)   Wt 235 lb 9.6 oz (106.9 kg)   BMI 38.03 kg/m   Pleasant, obese male in no distress HEENT: PERRL, EOMI, conjunctiva and sclera are clear Neck: c-spine nontender. No lymphadenopathy, thyromegaly or mass Heart: regular rate and rhythm, no murmur Lungs: clear bilaterally Abdomen: soft, obese, nontender, no mass Extremities: no edema GU/rectal exam: normal external genitalia without lesions. Testicles are normal without masses.  No inguinal hernias. Normal sphincter tone.  Prostate is smooth, no nodules, no significant enlargement.  Heme negative light brown stool.  No masses   Lab Results  Component Value Date   HGBA1C 5.7 05/22/2016    ASSESSMENT/PLAN:  Type 2 diabetes mellitus without complication, without long-term current use of insulin (HCC) - A1c improved; continue wt loss; encouraged exercise routine and continued lowfat, low carb diet - Plan: HgB A1c, Comprehensive metabolic panel  Essential hypertension, benign - controlled - Plan: Comprehensive metabolic panel, amLODipine (NORVASC) 10 MG tablet  Morbid obesity, unspecified obesity type ([place]) - counseled re: risks of obesity; counseled re: diet and exercise  Hypercholesteremia - Plan: Comprehensive metabolic panel, Lipid panel  Encounter for special screening examination for neoplasm of prostate - normal prostate exam today (exam had been refused at his physical)  Cough - after eating in evenings--suspect reflux.  Diet/behavior discussed in detail   c-met, lipid  I suspect that your cough after eating in the evenings is related to reflux. See handout--avoiding caffeine, chocolate, spicy foods, citrus/acidic (ie tomatoes, lemon/lime/orange/pineapple); eating small frequent meals rather than large quantities at a time can also help.  Please look into a regular exercise program such as Silver Sneakers (check with your insurance to see if they cover this). Remember that your goal is 150  minutes each week of aerobic exercise (30 minutes 5 days/week, can be in 15 minute intervals if you can't do it all at once).   Please periodically check your blood sugars.

## 2016-05-22 ENCOUNTER — Ambulatory Visit (INDEPENDENT_AMBULATORY_CARE_PROVIDER_SITE_OTHER): Payer: PPO | Admitting: Family Medicine

## 2016-05-22 ENCOUNTER — Encounter: Payer: Self-pay | Admitting: Family Medicine

## 2016-05-22 VITALS — BP 128/70 | HR 68 | Ht 66.0 in | Wt 235.6 lb

## 2016-05-22 DIAGNOSIS — E119 Type 2 diabetes mellitus without complications: Secondary | ICD-10-CM | POA: Diagnosis not present

## 2016-05-22 DIAGNOSIS — E7800 Pure hypercholesterolemia, unspecified: Secondary | ICD-10-CM | POA: Diagnosis not present

## 2016-05-22 DIAGNOSIS — I10 Essential (primary) hypertension: Secondary | ICD-10-CM | POA: Diagnosis not present

## 2016-05-22 DIAGNOSIS — R05 Cough: Secondary | ICD-10-CM

## 2016-05-22 DIAGNOSIS — R059 Cough, unspecified: Secondary | ICD-10-CM

## 2016-05-22 DIAGNOSIS — Z125 Encounter for screening for malignant neoplasm of prostate: Secondary | ICD-10-CM

## 2016-05-22 LAB — LIPID PANEL
Cholesterol: 99 mg/dL — ABNORMAL LOW (ref 125–200)
HDL: 42 mg/dL (ref >=40)
LDL CALC: 45 mg/dL (ref <130)
Total CHOL/HDL Ratio: 2.4 Ratio (ref <=5.0)
Triglycerides: 62 mg/dL (ref <150)
VLDL: 12 mg/dL (ref <30)

## 2016-05-22 LAB — COMPREHENSIVE METABOLIC PANEL
ALK PHOS: 66 U/L (ref 40–115)
ALT: 26 U/L (ref 9–46)
AST: 18 U/L (ref 10–35)
Albumin: 4.3 g/dL (ref 3.6–5.1)
BILIRUBIN TOTAL: 1 mg/dL (ref 0.2–1.2)
BUN: 14 mg/dL (ref 7–25)
CALCIUM: 9.3 mg/dL (ref 8.6–10.3)
CO2: 28 mmol/L (ref 20–31)
CREATININE: 0.61 mg/dL — AB (ref 0.70–1.25)
Chloride: 103 mmol/L (ref 98–110)
GLUCOSE: 134 mg/dL — AB (ref 65–99)
Potassium: 3.7 mmol/L (ref 3.5–5.3)
SODIUM: 141 mmol/L (ref 135–146)
Total Protein: 7 g/dL (ref 6.1–8.1)

## 2016-05-22 LAB — POCT GLYCOSYLATED HEMOGLOBIN (HGB A1C): Hemoglobin A1C: 5.7

## 2016-05-22 MED ORDER — AMLODIPINE BESYLATE 10 MG PO TABS: 10.0000 mg | ORAL_TABLET | Freq: Every day | ORAL | 1 refills | Status: DC

## 2016-05-22 NOTE — Patient Instructions (Signed)
  I suspect that your cough after eating in the evenings is related to reflux. See handout--avoiding caffeine, chocolate, spicy foods, citrus/acidic (ie tomatoes, lemon/lime/orange/pineapple); eating small frequent meals rather than large quantities at a time can also help.  Please look into a regular exercise program such as Silver Sneakers (check with your insurance to see if they cover this). Remember that your goal is 150 minutes each week of aerobic exercise (30 minutes 5 days/week, can be in 15 minute intervals if you can't do it all at once).   Please periodically check your blood sugars.   Food Choices for Gastroesophageal Reflux Disease, Adult When you have gastroesophageal reflux disease (GERD), the foods you eat and your eating habits are very important. Choosing the right foods can help ease the discomfort of GERD. WHAT GENERAL GUIDELINES DO I NEED TO FOLLOW?  Choose fruits, vegetables, whole grains, low-fat dairy products, and low-fat meat, fish, and poultry.  Limit fats such as oils, salad dressings, butter, nuts, and avocado.  Keep a food diary to identify foods that cause symptoms.  Avoid foods that cause reflux. These may be different for different people.  Eat frequent small meals instead of three large meals each day.  Eat your meals slowly, in a relaxed setting.  Limit fried foods.  Cook foods using methods other than frying.  Avoid drinking alcohol.  Avoid drinking large amounts of liquids with your meals.  Avoid bending over or lying down until 2-3 hours after eating. WHAT FOODS ARE NOT RECOMMENDED? The following are some foods and drinks that may worsen your symptoms: Vegetables Tomatoes. Tomato juice. Tomato and spaghetti sauce. Chili peppers. Onion and garlic. Horseradish. Fruits Oranges, grapefruit, and lemon (fruit and juice). Meats High-fat meats, fish, and poultry. This includes hot dogs, ribs, ham, sausage, salami, and bacon. Dairy Whole milk  and chocolate milk. Sour cream. Cream. Butter. Ice cream. Cream cheese.  Beverages Coffee and tea, with or without caffeine. Carbonated beverages or energy drinks. Condiments Hot sauce. Barbecue sauce.  Sweets/Desserts Chocolate and cocoa. Donuts. Peppermint and spearmint. Fats and Oils High-fat foods, including [place] fries and potato chips. Other Vinegar. Strong spices, such as black pepper, white pepper, red pepper, cayenne, curry powder, cloves, ginger, and chili powder. The items listed above may not be a complete list of foods and beverages to avoid. Contact your dietitian for more information.   This information is not intended to replace advice given to you by your health care provider. Make sure you discuss any questions you have with your health care provider.   Document Released: 07/31/2005 Document Revised: 08/21/2014 Document Reviewed: 06/04/2013 Elsevier Interactive Patient Education [employer].

## 2016-05-23 ENCOUNTER — Encounter: Payer: Self-pay | Admitting: Family Medicine

## 2016-06-09 DIAGNOSIS — H524 Presbyopia: Secondary | ICD-10-CM | POA: Diagnosis not present

## 2016-06-09 DIAGNOSIS — E119 Type 2 diabetes mellitus without complications: Secondary | ICD-10-CM | POA: Diagnosis not present

## 2016-06-09 LAB — HM DIABETES EYE EXAM

## 2016-06-14 ENCOUNTER — Encounter: Payer: Self-pay | Admitting: *Deleted

## 2016-08-11 ENCOUNTER — Other Ambulatory Visit: Payer: Self-pay | Admitting: Family Medicine

## 2016-08-11 NOTE — Telephone Encounter (Signed)
Is this okay to refill? 

## 2016-08-22 ENCOUNTER — Encounter: Payer: Self-pay | Admitting: Family Medicine

## 2016-08-22 ENCOUNTER — Ambulatory Visit (INDEPENDENT_AMBULATORY_CARE_PROVIDER_SITE_OTHER): Payer: PPO | Admitting: Family Medicine

## 2016-08-22 VITALS — BP 134/82 | HR 64 | Temp 98.3°F | Resp 16

## 2016-08-22 DIAGNOSIS — R0789 Other chest pain: Secondary | ICD-10-CM | POA: Diagnosis not present

## 2016-08-22 NOTE — Progress Notes (Signed)
Subjective:    Patient ID: Shannon Stone, male    DOB: August 30, 1945, 71 y.o.   MRN: [id number]  HPI Chief Complaint  Patient presents with  . chest discomfort    chest discomfort- couple weeks- catch breathe but feels an adrenline rush    He is a 71 year old caucasian male with a history of HTN, controlled DM type 2, obesity, and OSA but not using CPAP due to a recent dental procedure that has made is mask not fit as well. He plans to call and schedule an appointment to take care of this.  He normally sees Dr. Tomi Bamberger but is seeing me today for complaints of a 2 week history of intermittent sensations in his chest that feels like he is "skipping a breath" followed by a brief burst of "energy or anxiety".  Reports the sensations come on at random times and last for a split second only.  States they occur 5-10 times an hour. He does not notice them while sleeping. No aggravating or alleviating factors.  Denies pain, palpitations, shortness of breath, nausea or GI upset.   States he has been exerting himself at times and this does not bring on the sensation. He states there is nothing he can do to make the sensation occur. States it has not happened since he came in for his appointment.   Denies fever, chills, fatigue, unexplained weight change, rhinorrhea, congestion, cough, orthopnea, LE edema.    States he has an appointment with Dr. Tomi Bamberger in 2 days and declines weight check or labs today.     Past Medical History:  Diagnosis Date  . Alcohol dependence ([place])    history of abuse/dependence-s/p inpatient rehab at [facility] 08/2008  . Anxiety   . Arthritis    OA-knees, elbow  . Brachial neuritis or radiculitis NOS 03/03/2014  . Complication of anesthesia    DIFFICULTY URINATING  . Depression    and anxiety  . Diabetes mellitus without complication ([place])    history of borderline DM/elevated fasting glucose.  . Diverticulosis    sigmoid(5/08)  . Dry eye   . Elevated LFTs    (ETOH)fatty infiltration on u/s  . Esophageal reflux   . 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 Hypertension   . Impotence of organic origin    erectile dysfunction--has seen Dr. Serita Butcher  . Macular degeneration   . Microscopic hematuria    eval by Dr. Serita Butcher (pt reports h/o kidney stones)  . Neck pain    cervical radiculopathy-Dr. Sherley Bounds; persistent, s/p surgery  . Nocturia   . Other and unspecified hyperlipidemia    h/o elevated cholesterol  . Periodic limb movements of sleep    per sleep study 11/02  . Plantar fasciitis    treated by Dr. Paulla Dolly (injection)  . Psoriasis   . Rosacea   . 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 Sleep apnea    (done at [facility]); central and obstructive; using device from dentist  . Spinal stenosis in cervical region 03/03/14  . 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 Urgency of urination    Past Surgical History:  Procedure Laterality Date  . CERVICAL SPINE SURGERY  2011, 2013   Dr. Sherley Bounds  . KNEE SURGERY Left 2013  . POSTERIOR CERVICAL FUSION/FORAMINOTOMY  12/01/2011   Procedure: POSTERIOR CERVICAL FUSION/FORAMINOTOMY LEVEL 1;  Surgeon: Eustace Moore, MD;  Location: [facility] NEURO ORS;  Service: Neurosurgery;  Laterality: Bilateral;  Cervical five-six posterior cervical fusion   . UMBILICAL HERNIA REPAIR     AS CHILD    Social History   Social History  . Marital status: Single    Spouse name: N/A  . Number of children: 3  . Years of education: N/A   Occupational History  . retired Midwife)    Social History Main Topics  . Smoking status: Former Smoker    Packs/day: 1.00    Years: 18.00    Types: Cigarettes    Quit date: 08/14/1990  . Smokeless tobacco: Never Used  . Alcohol use Yes     Comment: zero drinks in 5 years.  sober since 08/26/2008  . Drug use: No  . Sexual activity: Not Currently   Other Topics Concern  . Not on file   Social History  Narrative   Divorced.  Lives alone, 1 cat.  Retired Conservation officer, nature.  Alcoholic--sober since [id number].  Active in AA, daily meetings, and he volunteers at [employer] (where he did rehab), detox at [facility] and at the jail.  2 daughters and a son (1 in [place], 1 in [place], [place], 1 in [place], [place]). 5 grandchildren      Review of Systems Pertinent positives and negatives in the history of present illness.     Objective:   Physical Exam  Constitutional: He is oriented to person, place, and time. He appears well-developed and well-nourished. No distress.  obese  HENT:  Mouth/Throat: Uvula is midline, oropharynx is clear and moist and mucous membranes are normal.  Neck: No JVD present.  Cardiovascular: Regular rhythm, normal heart sounds and normal pulses.  Bradycardia present.   No LE edema  Pulmonary/Chest: Effort normal and breath sounds normal. He exhibits no tenderness.  Neurological: He is alert and oriented to person, place, and time. He has normal strength. No sensory deficit. Gait normal.  Skin: Skin is warm and dry. No rash noted. He is not diaphoretic. No pallor.  Psychiatric: He has a normal mood and affect. His speech is normal and behavior is normal. Thought content normal.   BP 134/82   Pulse 64   Temp 98.3 F (36.8 C) (Oral)   Resp 16   SpO2 96%       Assessment & Plan:  Discomfort in chest  Discussed risk factors for heart disease and the importance of keeping his blood pressure, blood sugars and cholesterol under control and review of his chart shows that he is well managed. Discussed obesity and sedentary lifestyle and encouraged him to be more active. Discussed ideas for increasing activity including exercising in front of the TV since this is his favorite past time.  ECG: unremarkable except for bradycardia and this is unchanged compared to ECG from 2014.  Discussed that his symptoms do not appear to be cardiac related and he does not appear to be in any  danger.  Educated him on how to check his carotid pulse and will have him do this when he has the sensation of missing a "breath". He will report to Dr. Tomi Bamberger on Thursday at his previously scheduled appointment what he is noticing.  Discussed red flags of cardiac symptoms and when to seek immediate medical attention.  Declined labs today and I am ok with him waiting until Thursday for this.

## 2016-08-22 NOTE — Patient Instructions (Addendum)
When you have the sensation in your chest, check your pulse as we practiced in the office and see how fast it is and if it is regular or not. Follow up with Dr. Tomi Bamberger as scheduled Thursday.  If you notice any new or worsening symptoms, you will need to be seen again and depending on the severity of the symptoms, you may need to go straight to the closest emergency department. I do not expect this to happen.

## 2016-08-23 NOTE — Addendum Note (Signed)
Addended by: Arley Phenix L on: 08/23/2016 08:19 AM   Modules accepted: Orders

## 2016-08-23 NOTE — Progress Notes (Signed)
Chief Complaint  Patient presents with  . Diabetes    fasting med check.    He saw Vickie earlier this week with complaint of 2 week history of intermittent sensations in his chest that feels like he is "skipping a breath" followed by a brief burst of "energy or anxiety", like an adrenaline rush.  Didn't have any episodes yesterday, but having again today, earlier this morning.  Very short-lived. Seems to come on at random times and last for a split second only. Not related to eating, not while sleeping, not with exertion.  He hasn't been wearing his oral appliance for his OSA -won't fit after his last dental work.  Planning on more dental work in April, and will have it remade afterwards. Didn't tolerate CPAP at all and refuses re-trial.  EKG was unremarkable, bradycardia noted. He also reports some discomfort in his chest, in the center.  Not necessarily related to these other complaints; mild. He continues to cough some after eating. We discussed this at his last visit, and recommended trial of PPI. He hasn't tried this yet.  Hypertension follow-up: Every once in a while he will check it at the pharmacy, and it is okay (max of 140/80, usually 130's/70's.  Denies dizziness, headaches. Denies any muscle cramps or spasms. Denies side effects of medications. Tries to follow a low sodium diet.  DM--hasn't been checking his sugars regularly. Admits that his diet hasn't been very good related to holidays, travel. He continues to buy (and eat) cookies, when he sees them on sale for buy one get one free. Last A1c prior to today's visit: Lab Results  Component Value Date   HGBA1C 5.7 05/22/2016   Obesity: He has gained weight since last visit. In that time he has traveled to [place], [place], [place], plus had the holidays of Thanksgiving and Christmas.  Admits to a lot of overeating.  Hasn't been eating the candy he has in the house. Still buying/eating cookies when on sale. Nordictrack--using it  just once a week for 10 minutes. He thinks that getting his hearing aids next week may help with his exercise, as he will be able to hear the TV better, and so watch TV while on the Nordictrack.  Anxiety: Paxil is helpful, keeps him calmer, no longer gets angry. Denies side effects and prefers to continue this.  Dry feet improving  PMH, PSH, SH reviewed  Outpatient Encounter Prescriptions as of 08/24/2016  Medication Sig Note  . amLODipine (NORVASC) 10 MG tablet Take 1 tablet (10 mg total) by mouth daily.   Marland Kitchen aspirin EC 81 MG tablet Take 81 mg by mouth daily.   Marland Kitchen atenolol-chlorthalidone (TENORETIC) 50-25 MG tablet Take 1 tablet by mouth daily   . atorvastatin (LIPITOR) 40 MG tablet Take 1 tablet by mouth daily   . Blood Glucose Monitoring Suppl (ACCU-CHEK AVIVA PLUS) W/DEVICE KIT 1 each by Does not apply route 2 (two) times daily. 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 budesonide (RHINOCORT ALLERGY) 32 MCG/ACT nasal spray Place into both nostrils daily.   . clobetasol cream (TEMOVATE) 7.98 % Apply 1 application topically daily. Reported on 02/24/2016   . glucose blood (ACCU-CHEK AVIVA PLUS) test strip Test twice daily   . LANCETS ULTRA THIN 30G MISC 1 each by Does not apply route 2 (two) times daily. E11.9-DX test twice daily   . metroNIDAZOLE (METROCREAM) 0.75 % cream Apply 1 application topically at bedtime. Reported on 02/24/2016 08/06/2014: Uses prn with flares  .  Multiple Vitamins-Minerals (PRESERVISION AREDS PO) Take 2 tablets by mouth daily.   . Omega-3 Fatty Acids (FISH OIL PO) Take 1 capsule by mouth daily.   Marland Kitchen PARoxetine (PAXIL) 40 MG tablet Take 1 tablet by mouth every morning   . potassium chloride SA (K-DUR,KLOR-CON) 20 MEQ tablet Take 2 tablets by mouth every day   . Propylene Glycol (SYSTANE BALANCE OP) Apply 2 drops to eye daily. 1 drop in each eye once a day.   . thiamine (VITAMIN B-1) 100 MG tablet Take 100 mg by mouth daily.    No facility-administered encounter medications on  file as of 08/24/2016.    Allergies  Allergen Reactions  . Betadine [Povidone Iodine] Rash   ROS:  No fever, chills, URI symptoms, GI or GU complaints.  +chest pain, palpitations, cough after eating intermittently as per HPI.  No edema, bleeding, bruising, rash, joint pains, depression.   PHYSICAL EXAM:  BP 122/72 (BP Location: Left Arm, Patient Position: Sitting, Cuff Size: Normal)   Pulse 60   Ht _0  (1.676 m)   Wt 246 lb (111.6 kg)   BMI 39.71 kg/m   Wt Readings from Last 3 Encounters:  08/24/16 246 lb (111.6 kg)  05/22/16 235 lb 9.6 oz (106.9 kg)  05/08/16 230 lb (104.3 kg)   Well developed, obese male, in no distress Neck: no lymphadenopathy, thyromegaly or mass Heart: regular rate and rhythm, no murmur, rub, gallop or ectopy Lungs: clear bilaterally Abdomen: obese, nontender Extremities: no edema Skin: normal turgor, no rash Psych: normal mood, affect, hygiene and grooming  Lab Results  Component Value Date   HGBA1C 6.7 08/24/2016   ASSESSMENT/PLAN:  Morbid obesity, unspecified obesity type ([place]) - significant weight gain; counseled re: risks, diet, exercise. Noncompliant. May need meds to help--discussed Contrave and also Qsymia  Discomfort in chest - many cardiac risk factors, refer to cardiology for eval. May have reflux component, recommended PPI trial - Plan: Ambulatory referral to Cardiology  Essential hypertension, benign - controlled  Type 2 diabetes mellitus without complication, without long-term current use of insulin ([place]) - 1 point increase in A1c since last visit. Still "controlled"--discussed his noncompliance with eating/exercise/weight. Start meds if not improved at f/u - Plan: HgB A1c  Palpitations - needs further eval with monitor, likely echo and poss stress test given also having CP. Refer to cardiology - Plan: Ambulatory referral to Cardiology  Counseled extensively re: his weight, lack of exercise, and risk factors (especially now that he  is exhibiting some cardiac symptoms).  Congratulated again on his sobriety--which is ongoing hard work, but he brings this up ("at least I quit alcohol") whenever we talk about his ongoing risks.  Reinforced that this is NOT ENOUGH--his cardiovascular risks are still high.  Discussed medications for weight loss--this has not been my first choice, because I truly want him to get 150 min of exercise/week, but at this point, we at least need to try and get the weight off. He is on SSRI, so Belviq isn't an option.  Given his h/o alcoholism, perhaps Contrave would be the best choice, but Qsymia is also good.  I told him to check with his insurance to see the costs of these medications--this might be motivation for him if he cannot afford them, to try and do it on his own.    Please do the trial of omeprazole/prilosec as we previously discussed.  Take it once daily before breakfast. Follow the dietary precautions we also reviewed.  Refer to cardiology.  Increase nordictrack--discussed goals of 150 minutes/week  Patient reports that he will be willing to start DM meds if not improved by April. Prefers to hold off on this today.

## 2016-08-24 ENCOUNTER — Encounter: Payer: Self-pay | Admitting: Family Medicine

## 2016-08-24 ENCOUNTER — Ambulatory Visit (INDEPENDENT_AMBULATORY_CARE_PROVIDER_SITE_OTHER): Payer: PPO | Admitting: Family Medicine

## 2016-08-24 ENCOUNTER — Telehealth: Payer: Self-pay | Admitting: Family Medicine

## 2016-08-24 DIAGNOSIS — R0789 Other chest pain: Secondary | ICD-10-CM

## 2016-08-24 DIAGNOSIS — E119 Type 2 diabetes mellitus without complications: Secondary | ICD-10-CM

## 2016-08-24 DIAGNOSIS — I10 Essential (primary) hypertension: Secondary | ICD-10-CM

## 2016-08-24 DIAGNOSIS — R002 Palpitations: Secondary | ICD-10-CM

## 2016-08-24 LAB — POCT GLYCOSYLATED HEMOGLOBIN (HGB A1C): Hemoglobin A1C: 6.7

## 2016-08-24 NOTE — Patient Instructions (Addendum)
Increase your exercise gradually, up to 150 minutes/week. I recommend gradually increasing the time on the nordictrack, sometimes using it more than once daily, if needed. I encourage you to link it with a program you enjoy watching on TV. Use alarms if you need to as reminders to exercise.  I am worried about your heart--you have  Many risk factors for heart disease, including your weight, diabetes, high blood pressure and cholesterol (although these are well controlled with medications). Sleep apnea is also a risk factor that is not currently being treated.  Getting the new appliance when you can, and losing weight is VERY important for your heart health.  We discussed the medication called  Contrave for weight loss.  I don't know what your out of pocket cost would be for this medication.  Check with your insurance, and if you aren't losing weight on your own by the next visit, we will need to start a medication to aid your weight loss, such as Contrave.  If they don't cover this medication, see what alternatives they do cover--Qsymia would be another option.

## 2016-08-24 NOTE — Telephone Encounter (Signed)
This wasn't prescribed.  This (contrave) or Qsymia were suggested for weight loss--he was advised to check on the cost. My hope was that it was expensive, and he would be more motivated to try to lose weight on his own, rather than pay a large amount, BUT I was willing to prescribe it if affordable.  With that being said--this doesn't tell me (or him) the cost of the medication. If it is non-preferred, is Qsymia preferred, and what is the cost.  So, prior to getting prior auth, see if there are preferred weight loss options (he is NOT a candidate for Belviq due to SSRI use).

## 2016-08-24 NOTE — Telephone Encounter (Signed)
Patient called his insurance and was told we need to call [facility] Pharmacist [phone] for a Formulary exception Copy to Mickel Baas

## 2016-08-25 ENCOUNTER — Encounter: Payer: Self-pay | Admitting: Family Medicine

## 2016-08-28 NOTE — Telephone Encounter (Signed)
dt ?

## 2016-09-06 ENCOUNTER — Ambulatory Visit (INDEPENDENT_AMBULATORY_CARE_PROVIDER_SITE_OTHER): Payer: PPO | Admitting: Cardiology

## 2016-09-06 ENCOUNTER — Ambulatory Visit: Payer: Medicare Other | Admitting: Cardiology

## 2016-09-06 ENCOUNTER — Encounter: Payer: Self-pay | Admitting: Cardiology

## 2016-09-06 VITALS — BP 120/60 | HR 64 | Ht 66.0 in | Wt 250.4 lb

## 2016-09-06 DIAGNOSIS — R002 Palpitations: Secondary | ICD-10-CM | POA: Insufficient documentation

## 2016-09-06 DIAGNOSIS — R079 Chest pain, unspecified: Secondary | ICD-10-CM | POA: Diagnosis not present

## 2016-09-06 DIAGNOSIS — I10 Essential (primary) hypertension: Secondary | ICD-10-CM

## 2016-09-06 HISTORY — DX: Palpitations: R00.2

## 2016-09-06 NOTE — Progress Notes (Signed)
Cardiology Office Note    Date:  09/06/2016   ID:  Shannon Stone, DOB Jan 26, 1946, MRN [id number]  PCP:  Vikki Ports, MD  Cardiologist:  Fransico Him, MD   Chief Complaint  Patient presents with  . New Evaluation    palpitations and chest discomfort    History of Present Illness:  Shannon Stone is a 71 y.o. male with a history of HTN, DM and obesity who presents today for evaluation of chest pain.  He was seen recently by his PCP complaining of chest discomfort that he describes as a skipping of his breath followed by a feeling of anxiety. He also notices a pressure in his chest at times as well.  He says that he feels like his heart is skipping a beat.  He will get some nausea on occasion with the episodes but no diaphoresis.  These sensations occur a random times lasting a few seconds but occur multiple times a day.  They are nonexertional.  He denies any exertional chest pain, SOB.  He denies any recent alcohol use but dose drink 2 cups of coffee in the am and then tea in the afternoon and the evening.    Past Medical History:  Diagnosis Date  . Alcohol dependence ([place])    history of abuse/dependence-s/p inpatient rehab at [facility] 08/2008  . Anxiety   . Arthritis    OA-knees, elbow  . Brachial neuritis or radiculitis NOS 03/03/2014  . Complication of anesthesia    DIFFICULTY URINATING  . Depression    and anxiety  . Diabetes mellitus without complication ([place])    history of borderline DM/elevated fasting glucose.  . Diverticulosis    sigmoid(5/08)  . Dry eye   . Elevated LFTs    (ETOH)fatty infiltration on u/s  . Esophageal reflux   . Heart palpitations 09/06/2016  . 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 Hypertension   . Impotence of organic origin    erectile dysfunction--has seen Dr. Serita Butcher  . Macular degeneration   . Microscopic hematuria    eval by Dr. Serita Butcher (pt reports h/o kidney stones)  . Neck pain    cervical  radiculopathy-Dr. Sherley Bounds; persistent, s/p surgery  . Nocturia   . Other and unspecified hyperlipidemia    h/o elevated cholesterol  . Periodic limb movements of sleep    per sleep study 11/02  . Plantar fasciitis    treated by Dr. Paulla Dolly (injection)  . Psoriasis   . Rosacea   . 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 Sleep apnea    (done at [facility]); central and obstructive; using device from dentist  . Spinal stenosis in cervical region 03/03/14  . 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 Urgency of urination     Past Surgical History:  Procedure Laterality Date  . CERVICAL SPINE SURGERY  2011, 2013   Dr. Sherley Bounds  . KNEE SURGERY Left 2013  . POSTERIOR CERVICAL FUSION/FORAMINOTOMY  12/01/2011   Procedure: POSTERIOR CERVICAL FUSION/FORAMINOTOMY LEVEL 1;  Surgeon: Eustace Moore, MD;  Location: [facility];  Service: Neurosurgery;  Laterality: Bilateral;  Cervical five-six posterior cervical fusion   . UMBILICAL HERNIA REPAIR     AS CHILD     Current Medications: Outpatient Medications Prior to Visit  Medication Sig Dispense Refill  . amLODipine (NORVASC) 10 MG tablet Take 1 tablet (  10 mg total) by mouth daily. 90 tablet 1  . aspirin EC 81 MG tablet Take 81 mg by mouth daily.    Marland Kitchen atenolol-chlorthalidone (TENORETIC) 50-25 MG tablet Take 1 tablet by mouth daily 90 tablet 0  . atorvastatin (LIPITOR) 40 MG tablet Take 1 tablet by mouth daily 90 tablet 2  . Blood Glucose Monitoring Suppl (ACCU-CHEK AVIVA PLUS) W/DEVICE KIT 1 each by Does not apply route 2 (two) times daily. DX-E11.9 patient is to test BID 1 kit 0  . budesonide (RHINOCORT ALLERGY) 32 MCG/ACT nasal spray Place into both nostrils daily.    . clobetasol cream (TEMOVATE) 4.94 % Apply 1 application topically daily. Reported on 02/24/2016    . glucose blood (ACCU-CHEK AVIVA PLUS) test strip Test twice daily 100 each 5  . LANCETS ULTRA THIN 30G MISC 1 each by Does not apply route 2  (two) times daily. E11.9-DX test twice daily 100 each 5  . metroNIDAZOLE (METROCREAM) 0.75 % cream Apply 1 application topically at bedtime. Reported on 02/24/2016    . Multiple Vitamins-Minerals (PRESERVISION AREDS PO) Take 2 tablets by mouth daily.    . Omega-3 Fatty Acids (FISH OIL PO) Take 1 capsule by mouth daily.    Marland Kitchen PARoxetine (PAXIL) 40 MG tablet Take 1 tablet by mouth every morning 90 tablet 1  . potassium chloride SA (K-DUR,KLOR-CON) 20 MEQ tablet Take 2 tablets by mouth every day 180 tablet 0  . Propylene Glycol (SYSTANE BALANCE OP) Apply 2 drops to eye daily. 1 drop in each eye once a day.    . thiamine (VITAMIN B-1) 100 MG tablet Take 100 mg by mouth daily.     No facility-administered medications prior to visit.      Allergies:   Betadine [povidone iodine]   Social History   Social History  . Marital status: Single    Spouse name: N/A  . Number of children: 3  . Years of education: N/A   Occupational History  . retired Midwife)    Social History Main Topics  . Smoking status: Former Smoker    Packs/day: 1.00    Years: 18.00    Types: Cigarettes    Quit date: 08/14/1990  . Smokeless tobacco: Never Used  . Alcohol use Yes     Comment: zero drinks in 5 years.  sober since 08/26/2008  . Drug use: No  . Sexual activity: Not Currently   Other Topics Concern  . None   Social History Narrative   Divorced.  Lives alone, 1 cat.  Retired Conservation officer, nature.  Alcoholic--sober since 11/9673.  Active in AA, daily meetings, and he volunteers at [employer] (where he did rehab), detox at [facility] and at the jail.  2 daughters and a son (1 in [place], 1 in [place], [place], 1 in [place], [place]). 5 grandchildren     Family History:  The patient's family history includes Alcohol abuse in his mother; Arthritis in his mother; Asthma in his father; CVA in his mother; Cancer in his maternal uncle and paternal grandfather; Dementia in his father; Diabetes in his other; Heart disease  in his sister; Hodgkin's lymphoma in his sister; Hypertension in his father and mother; Parkinson's disease in his paternal grandmother; Transient ischemic attack (age of onset: 71) in his father.   ROS:   Please see the history of present illness.    ROS All other systems reviewed and are negative.  No flowsheet data found.     PHYSICAL EXAM:   VS:  BP 120/60   Pulse 64   Ht _0  (1.676 m)   Wt 250 lb 6.4 oz (113.6 kg)   SpO2 96%   BMI 40.42 kg/m    GEN: Well nourished, well developed, in no acute distress  HEENT: normal  Neck: no JVD, carotid bruits, or masses Cardiac: RRR; no murmurs, rubs, or gallops,no edema.  Intact distal pulses bilaterally.  Respiratory:  clear to auscultation bilaterally, normal work of breathing GI: soft, nontender, nondistended, + BS MS: no deformity or atrophy  Skin: warm and dry, no rash Neuro:  Alert and Oriented x 3, Strength and sensation are intact Psych: euthymic mood, full affect  Wt Readings from Last 3 Encounters:  09/06/16 250 lb 6.4 oz (113.6 kg)  08/24/16 246 lb (111.6 kg)  05/22/16 235 lb 9.6 oz (106.9 kg)      Studies/Labs Reviewed:   EKG:  EKG is not ordered today.    Recent Labs: 11/17/2015: Hemoglobin 15.3; Platelets 178; TSH 1.71 05/22/2016: ALT 26; BUN 14; Creat 0.61; Potassium 3.7; Sodium 141   Lipid Panel    Component Value Date/Time   CHOL 99 (L) 05/22/2016 1101   TRIG 62 05/22/2016 1101   HDL 42 05/22/2016 1101   CHOLHDL 2.4 05/22/2016 1101   VLDL 12 05/22/2016 1101   LDLCALC 45 05/22/2016 1101    Additional studies/ records that were reviewed today include:  OV notes from [facility]    ASSESSMENT:    1. Chest pain, unspecified type   2. Essential hypertension, benign   3. Heart palpitations      PLAN:  In order of problems listed above:  1. Chest pain that he describes as a pressure sensation that sometimes occurs with the palpitations and sometime not. This is somewhat atypical in that it is  nonexertional and not associated with diaphoresis but can occur with nausea. His CRFs include remote tobacco use, obesity, HTN and hyperlipidemia as well as a sister with CAD.  His EKG is nonischemia so I will get an ETT to rule out ischemia.  2. HTN - BP controlled on current meds. 3. Palpitations - he complains of a feeling that his heart skips and it takes his breath away.  The pressure in his chest also occurs sometimes with the palpitations.  I will get a 30 day heart monitor to assess further.     Medication Adjustments/Labs and Tests Ordered: Current medicines are reviewed at length with the patient today.  Concerns regarding medicines are outlined above.  Medication changes, Labs and Tests ordered today are listed in the Patient Instructions below.  There are no Patient Instructions on file for this visit.   Signed, Fransico Him, MD  09/06/2016 9:39 AM    [address], [place], [place]  [address] Phone: [phone]; Fax: [phone]

## 2016-09-06 NOTE — Patient Instructions (Signed)
Medication Instructions:  Your physician recommends that you continue on your current medications as directed. Please refer to the Current Medication list given to you today.   Labwork: None  Testing/Procedures: Your physician has requested that you have an exercise tolerance test. For further information please visit [email or web address]. Please also follow instruction sheet, as given.  Your physician has recommended that you wear an event monitor. Event monitors are medical devices that record the heart's electrical activity. Doctors most often [place] these monitors to diagnose arrhythmias. Arrhythmias are problems with the speed or rhythm of the heartbeat. The monitor is a small, portable device. You can wear one while you do your normal daily activities. This is usually used to diagnose what is causing palpitations/syncope (passing out).  Follow-Up: Your physician recommends that you schedule a follow-up appointment AS NEEDED with Dr. Radford Pax pending study results.  Any Other Special Instructions Will Be Listed Below (If Applicable).     If you need a refill on your cardiac medications before your next appointment, please call your pharmacy.

## 2016-09-07 NOTE — Telephone Encounter (Signed)
I called pt's pharmacy provider service t# [phone] & after long conversation there are no covered weight loss medications on his insurance plan brand or generic due to Medicare Part D.  Also spoke with appeals department and it would be a waste of time to try an appeal or formulary exception because Medicare Part D does not cover any weight loss medications and it would be denied.  So only option would be for pt to pay out of pocket and least expensive weight loss medication is Phentermine.

## 2016-09-07 NOTE — Telephone Encounter (Signed)
Thanks  Please let the patient know.

## 2016-09-08 ENCOUNTER — Ambulatory Visit (INDEPENDENT_AMBULATORY_CARE_PROVIDER_SITE_OTHER): Payer: PPO

## 2016-09-08 DIAGNOSIS — R079 Chest pain, unspecified: Secondary | ICD-10-CM | POA: Diagnosis not present

## 2016-09-08 DIAGNOSIS — R002 Palpitations: Secondary | ICD-10-CM | POA: Diagnosis not present

## 2016-09-08 LAB — EXERCISE TOLERANCE TEST
CHL RATE OF PERCEIVED EXERTION: 17
CSEPED: 5 min
CSEPEDS: 47 s
CSEPEW: 7 METS
MPHR: 151 {beats}/min
Peak HR: 136 {beats}/min
Percent HR: 90 %
Rest HR: 59 {beats}/min

## 2016-09-11 NOTE — Telephone Encounter (Signed)
Left message for pt

## 2016-09-12 ENCOUNTER — Telehealth: Payer: Self-pay

## 2016-09-12 ENCOUNTER — Other Ambulatory Visit: Payer: Self-pay | Admitting: Family Medicine

## 2016-09-12 DIAGNOSIS — I10 Essential (primary) hypertension: Secondary | ICD-10-CM

## 2016-09-12 NOTE — Telephone Encounter (Signed)
-----   Message from Sueanne Margarita, MD sent at 09/09/2016  4:45 PM EST ----- No ischemia noted but he had a hypertensive BP response to exercise.  Please get a 24 hour BP monitor

## 2016-09-12 NOTE — Telephone Encounter (Signed)
Informed patient of results and verbal understanding expressed.  24 hour BP monitor ordered for scheduling. Patient agrees with treatment plan. 

## 2016-09-13 ENCOUNTER — Telehealth: Payer: Self-pay | Admitting: Family Medicine

## 2016-09-13 NOTE — Telephone Encounter (Signed)
Pt informed of issue with weight loss meds and his insurnace

## 2016-09-19 ENCOUNTER — Encounter: Payer: Self-pay | Admitting: *Deleted

## 2016-09-19 ENCOUNTER — Ambulatory Visit (INDEPENDENT_AMBULATORY_CARE_PROVIDER_SITE_OTHER): Payer: PPO

## 2016-09-19 DIAGNOSIS — I10 Essential (primary) hypertension: Secondary | ICD-10-CM

## 2016-09-19 NOTE — Progress Notes (Signed)
Patient ID: Shannon Stone, male   DOB: April 16, 1946, 71 y.o.   MRN: [id number] 24 hour ambulatory blood pressure monitor applied to patient using standard adult cuff.

## 2016-09-22 ENCOUNTER — Encounter: Payer: Self-pay | Admitting: Medical

## 2016-09-22 ENCOUNTER — Ambulatory Visit (INDEPENDENT_AMBULATORY_CARE_PROVIDER_SITE_OTHER): Payer: PPO | Admitting: Medical

## 2016-09-22 VITALS — BP 144/80 | HR 62 | Wt 249.4 lb

## 2016-09-22 DIAGNOSIS — R059 Cough, unspecified: Secondary | ICD-10-CM

## 2016-09-22 DIAGNOSIS — R05 Cough: Secondary | ICD-10-CM

## 2016-09-22 MED ORDER — DEXLANSOPRAZOLE 30 MG PO CPDR: 30.0000 mg | DELAYED_RELEASE_CAPSULE | Freq: Every day | ORAL | 0 refills | Status: DC

## 2016-09-22 MED ORDER — PROMETHAZINE-DM 6.25-15 MG/5ML PO SYRP: 5.0000 mL | ORAL_SOLUTION | Freq: Four times a day (QID) | ORAL | 0 refills | Status: DC | PRN

## 2016-09-22 NOTE — Progress Notes (Signed)
Subjective: Chief Complaint  Patient presents with  . coughing , chest congestion    coughing , chest congestion  x2 weeks    Here for cough.   Been coughing for 2 weeks, some nausea, but no other symptoms.   Denies headache, sinus pressure, sore throat, runny nose, ear pain, congestion, SOB, wheezing, no vomiting, no diarrhea, no chills, no body aches, no fever.  Doesn't feel sick.  No hx/o GERD, no edema.  He has had some recent palpations, wearing even monitor and has f/u with cardiology.  Using nothing ofr symptoms.  Nonsmoker.  No other aggravating or relieving factors. No other complaint.  Past Medical History:  Diagnosis Date  . Alcohol dependence ([place])    history of abuse/dependence-s/p inpatient rehab at [facility] 08/2008  . Anxiety   . Arthritis    OA-knees, elbow  . Brachial neuritis or radiculitis NOS 03/03/2014  . Complication of anesthesia    DIFFICULTY URINATING  . Depression    and anxiety  . Diabetes mellitus without complication ([place])    history of borderline DM/elevated fasting glucose.  . Diverticulosis    sigmoid(5/08)  . Dry eye   . Elevated LFTs    (ETOH)fatty infiltration on u/s  . Esophageal reflux   . Heart palpitations 09/06/2016  . 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 Hypertension   . Impotence of organic origin    erectile dysfunction--has seen Dr. Serita Butcher  . Macular degeneration   . Microscopic hematuria    eval by Dr. Serita Butcher (pt reports h/o kidney stones)  . Neck pain    cervical radiculopathy-Dr. Sherley Bounds; persistent, s/p surgery  . Nocturia   . Other and unspecified hyperlipidemia    h/o elevated cholesterol  . Periodic limb movements of sleep    per sleep study 11/02  . Plantar fasciitis    treated by Dr. Paulla Dolly (injection)  . Psoriasis   . Rosacea   . 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 Sleep apnea    (done at [facility]); central and obstructive; using device from dentist  . Spinal  stenosis in cervical region 03/03/14  . 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 Urgency of urination     Current Outpatient Prescriptions on File Prior to Visit  Medication Sig Dispense Refill  . amLODipine (NORVASC) 10 MG tablet Take 1 tablet by mouth daily 90 tablet 0  . aspirin EC 81 MG tablet Take 81 mg by mouth daily.    Marland Kitchen atenolol-chlorthalidone (TENORETIC) 50-25 MG tablet Take 1 tablet by mouth daily 90 tablet 0  . atorvastatin (LIPITOR) 40 MG tablet Take 1 tablet by mouth daily 90 tablet 2  . Blood Glucose Monitoring Suppl (ACCU-CHEK AVIVA PLUS) W/DEVICE KIT 1 each by Does not apply route 2 (two) times daily. DX-E11.9 patient is to test BID 1 kit 0  . budesonide (RHINOCORT ALLERGY) 32 MCG/ACT nasal spray Place into both nostrils daily.    . clobetasol cream (TEMOVATE) 1.03 % Apply 1 application topically daily. Reported on 02/24/2016    . glucose blood (ACCU-CHEK AVIVA PLUS) test strip Test twice daily 100 each 5  . LANCETS ULTRA THIN 30G MISC 1 each by Does not apply route 2 (two) times daily. E11.9-DX test twice daily 100 each 5  . Multiple Vitamins-Minerals (PRESERVISION AREDS PO) Take 2 tablets by mouth daily.    Marland Kitchen  Omega-3 Fatty Acids (FISH OIL PO) Take 1 capsule by mouth daily.    Marland Kitchen PARoxetine (PAXIL) 40 MG tablet Take 1 tablet by mouth every morning 90 tablet 1  . potassium chloride SA (K-DUR,KLOR-CON) 20 MEQ tablet Take 2 tablets by mouth every day 180 tablet 0  . Propylene Glycol (SYSTANE BALANCE OP) Apply 2 drops to eye daily. 1 drop in each eye once a day.    . thiamine (VITAMIN B-1) 100 MG tablet Take 100 mg by mouth daily.    . metroNIDAZOLE (METROCREAM) 0.75 % cream Apply 1 application topically at bedtime. Reported on 02/24/2016     No current facility-administered medications on file prior to visit.     ROS as in subjective   Objective: BP (!) 144/80   Pulse 62   Wt 249 lb 6.4 oz (113.1 kg)   SpO2 97%   BMI 40.25 kg/m   Wt Readings from  Last 3 Encounters:  09/22/16 249 lb 6.4 oz (113.1 kg)  09/06/16 250 lb 6.4 oz (113.6 kg)  08/24/16 246 lb (111.6 kg)    General appearance: alert, no distress, WD/WN, obese white male HEENT: normocephalic, sclerae anicteric, TMs pearly, nares patent, no discharge or erythema, pharynx normal Oral cavity: MMM, no lesions Neck: supple, no lymphadenopathy, no thyromegaly, no masses Heart: RRR, normal S1, S2, no murmurs Lungs: CTA bilaterally, no wheezes, rhonchi, or rales Abdomen: +bs, soft, non tender, non distended, no masses, no hepatomegaly, no splenomegaly Pulses: 1+ symmetric, upper and lower extremities, normal cap refill Ext: no edema   Assessment: Encounter Diagnosis  Name Primary?  . Cough Yes    Plan: Discussed his syptoms which are limited to cough and nausea, possible GERD related.   Discussed treatment and recommendations below.   Patient Instructions  Cough  Your cough today doesn't suggest infection   It could be related to post nasal drainage or reflux  Avoid spicy and acidic foods for the time being  Don't eat with in 2 hours of bedtime  Begin samples of Dexilant, 1 capsule daily in the morning until you run out.   This is an acid reflux medication  Begin Promethazine DM cough syrup, 1 tsp every 6 hours as needed for cough and nausea  You can also try Mucinex plain OTC for drainage, cough, phlegm.  If not much improved within 4-5 days, call back   Jasiel was seen today for coughing , chest congestion.  Diagnoses and all orders for this visit:  Cough  Other orders -     promethazine-dextromethorphan (PROMETHAZINE-DM) 6.25-15 MG/5ML syrup; Take 5 mLs by mouth 4 (four) times daily as needed for cough. -     Dexlansoprazole (DEXILANT) 30 MG capsule; Take 1 capsule (30 mg total) by mouth daily.

## 2016-09-22 NOTE — Patient Instructions (Signed)
Cough  Your cough today doesn't suggest infection   It could be related to post nasal drainage or reflux  Avoid spicy and acidic foods for the time being  Don't eat with in 2 hours of bedtime  Begin samples of Dexilant, 1 capsule daily in the morning until you run out.   This is an acid reflux medication  Begin Promethazine DM cough syrup, 1 tsp every 6 hours as needed for cough and nausea  You can also try Mucinex plain OTC for drainage, cough, phlegm.  If not much improved within 4-5 days, call back

## 2016-09-26 ENCOUNTER — Telehealth: Payer: Self-pay

## 2016-09-26 DIAGNOSIS — I10 Essential (primary) hypertension: Secondary | ICD-10-CM

## 2016-09-26 MED ORDER — LOSARTAN POTASSIUM 25 MG PO TABS: 25.0000 mg | ORAL_TABLET | Freq: Every day | ORAL | 0 refills | Status: DC

## 2016-09-26 NOTE — Telephone Encounter (Signed)
Ambulatory BP report showed average BP 150/74.  Per Dr. Radford Pax, patient is to START LOSARTAN 25 mg daily. HTN Clinic OV and BMET scheduled 2/20. Patient agrees with treatment plan.

## 2016-10-03 ENCOUNTER — Ambulatory Visit (INDEPENDENT_AMBULATORY_CARE_PROVIDER_SITE_OTHER): Payer: PPO | Admitting: Pharmacist

## 2016-10-03 ENCOUNTER — Other Ambulatory Visit: Payer: PPO | Admitting: *Deleted

## 2016-10-03 VITALS — BP 128/62 | HR 54

## 2016-10-03 DIAGNOSIS — I10 Essential (primary) hypertension: Secondary | ICD-10-CM | POA: Diagnosis not present

## 2016-10-03 LAB — BASIC METABOLIC PANEL
BUN / CREAT RATIO: 16 (ref 10–24)
BUN: 11 mg/dL (ref 8–27)
CO2: 24 mmol/L (ref 18–29)
CREATININE: 0.69 mg/dL — AB (ref 0.76–1.27)
Calcium: 8.9 mg/dL (ref 8.6–10.2)
Chloride: 98 mmol/L (ref 96–106)
GFR calc non Af Amer: 97 (ref >59)
GFR, EST AFRICAN AMERICAN: 112 (ref >59)
GLUCOSE: 219 mg/dL — AB (ref 65–99)
Potassium: 3.6 mmol/L (ref 3.5–5.2)
Sodium: 141 mmol/L (ref 134–144)

## 2016-10-03 MED ORDER — LOSARTAN POTASSIUM 25 MG PO TABS: 25.0000 mg | ORAL_TABLET | Freq: Every day | ORAL | 3 refills | Status: DC

## 2016-10-03 MED ORDER — LOSARTAN POTASSIUM 25 MG PO TABS: 25.0000 mg | ORAL_TABLET | Freq: Every day | ORAL | 0 refills | Status: DC

## 2016-10-03 NOTE — Progress Notes (Signed)
Patient ID: Shannon Stone                 DOB: 11/19/1945                      MRN: [id number]     HPI: Shannon Stone is a 71 y.o. male referred by Dr. Radford Pax to HTN clinic. PMH is significant for HTN, DM, obesity, sleep apnea, and hx of Hepatitis C. Pt had a exercise stress test which did not show ischemia but did show a hypertensive response to exercise. He had a follow up ambulatory BP cuff placed, which showed an average BP of 150/74. Pt was started on losartan 4m daily 1 week ago and presents today for BMET and BP check.  Pt reports tolerating losartan well. He was a little nauseous his first day taking it, but sx improved after the first day. His urine is a bright yellow now and the onset correlates with starting losartan. Not a known side effect of losartan but checking BMET today and will f/u with kidney function. He took his medications this morning about 90 minutes ago. He had 1 cup of coffee this morning.  Current HTN meds: losartan 266mdaily, atenolol-chlorthalidone 50-2511maily, amlodipine 26m35mily  BP goal: <130/80mm95mFamily History: Alcohol abuse in his mother; Arthritis in his mother; Asthma in his father; CVA in his mother; Cancer in his maternal uncle and paternal grandfather; Dementia in his father; Diabetes in his other; Heart disease in his sister; Hodgkin's lymphoma in his sister; Hypertension in his father and mother; Parkinson's disease in his paternal grandmother; Transient ischemic attack (age of onset: 90) i44his father.   Social History: Former smoker 1 PPD for 18 years, quit in 1992. Sober since 08/26/2008. Denies illicit drug use.  Diet: Drinks 2 cups of coffee in the AM and tea in the afternoon and evening.  Exercise: Minimal. Knows he should walk more.  Home BP readings: Does not check.  Wt Readings from Last 3 Encounters:  09/22/16 249 lb 6.4 oz (113.1 kg)  09/06/16 250 lb 6.4 oz (113.6 kg)  08/24/16 246 lb (111.6 kg)   BP Readings from Last 3  Encounters:  09/22/16 (!) 144/80  09/06/16 120/60  08/24/16 122/72   Pulse Readings from Last 3 Encounters:  09/22/16 62  09/06/16 64  08/24/16 60    Renal function: CrCl cannot be calculated (Patient's most recent lab result is older than the maximum 21 days allowed.).  Past Medical History:  Diagnosis Date  . [place] [id number]  . Anxiety   . Arthritis    OA-knees, elbow  . Brachial neuritis or radiculitis NOS 03/03/2014  . Complication of anesthesia    DIFFICULTY URINATING  . Depression    and anxiety  . Diabetes mellitus without complication (HCC) [place]/elevated fasting glucose.  . Diverticulosis    sigmoid(5/08)  . Dry eye   . Elevated LFTs    (ETOH)fatty infiltration on u/s  . Esophageal reflux   . Heart palpitations 09/06/2016  . 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 Hypertension   . Impotence of organic origin    erectile dysfunction--has seen Dr. KimbrSerita Butcheracular degeneration   . Microscopic hematuria    eval by Dr. KimbrSerita Butcherreports h/o kidney stones)  . Neck pain  cervical radiculopathy-Dr. Sherley Bounds; persistent, s/p surgery  . Nocturia   . Other and unspecified hyperlipidemia    h/o elevated cholesterol  . Periodic limb movements of sleep    per sleep study 11/02  . Plantar fasciitis    treated by Dr. Paulla Dolly (injection)  . Psoriasis   . Rosacea   . 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 Sleep apnea    (done at [facility]); central and obstructive; using device from dentist  . Spinal stenosis in cervical region 03/03/14  . 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 Urgency of urination     Current Outpatient Prescriptions on File Prior to Visit  Medication Sig Dispense Refill  . amLODipine (NORVASC) 10 MG tablet Take 1 tablet by mouth daily 90 tablet 0  . aspirin EC 81 MG tablet  Take 81 mg by mouth daily.    Marland Kitchen atenolol-chlorthalidone (TENORETIC) 50-25 MG tablet Take 1 tablet by mouth daily 90 tablet 0  . atorvastatin (LIPITOR) 40 MG tablet Take 1 tablet by mouth daily 90 tablet 2  . Blood Glucose Monitoring Suppl (ACCU-CHEK AVIVA PLUS) W/DEVICE KIT 1 each by Does not apply route 2 (two) times daily. DX-E11.9 patient is to test BID 1 kit 0  . budesonide (RHINOCORT ALLERGY) 32 MCG/ACT nasal spray Place into both nostrils daily.    . clobetasol cream (TEMOVATE) 4.48 % Apply 1 application topically daily. Reported on 02/24/2016    . Dexlansoprazole (DEXILANT) 30 MG capsule Take 1 capsule (30 mg total) by mouth daily. 12 capsule 0  . glucose blood (ACCU-CHEK AVIVA PLUS) test strip Test twice daily 100 each 5  . LANCETS ULTRA THIN 30G MISC 1 each by Does not apply route 2 (two) times daily. E11.9-DX test twice daily 100 each 5  . losartan (COZAAR) 25 MG tablet Take 1 tablet (25 mg total) by mouth daily. 30 tablet 0  . metroNIDAZOLE (METROCREAM) 0.75 % cream Apply 1 application topically at bedtime. Reported on 02/24/2016    . Multiple Vitamins-Minerals (PRESERVISION AREDS PO) Take 2 tablets by mouth daily.    . Omega-3 Fatty Acids (FISH OIL PO) Take 1 capsule by mouth daily.    Marland Kitchen PARoxetine (PAXIL) 40 MG tablet Take 1 tablet by mouth every morning 90 tablet 1  . potassium chloride SA (K-DUR,KLOR-CON) 20 MEQ tablet Take 2 tablets by mouth every day 180 tablet 0  . promethazine-dextromethorphan (PROMETHAZINE-DM) 6.25-15 MG/5ML syrup Take 5 mLs by mouth 4 (four) times daily as needed for cough. 120 mL 0  . Propylene Glycol (SYSTANE BALANCE OP) Apply 2 drops to eye daily. 1 drop in each eye once a day.    . thiamine (VITAMIN B-1) 100 MG tablet Take 100 mg by mouth daily.     No current facility-administered medications on file prior to visit.     Allergies  Allergen Reactions  . Betadine [Povidone Iodine] Rash     Assessment/Plan:  1. Hypertension - BP improved and now at  goal <130/59mHg since adding losartan 28mdaily. Checking BMET today. Will continue current BP medications and pt will call with any problems. F/u as needed.   Ada Woodbury E. Evelette Hollern, PharmD, CPP, [id number]. [id number] [address]hone: [phone]: ([phone]/20/2018 11:48 AM

## 2016-11-03 ENCOUNTER — Telehealth: Payer: Self-pay | Admitting: Family Medicine

## 2016-11-03 NOTE — Telephone Encounter (Signed)
We don't have the documentation of his last colonoscopy, but at his last AWV we documented 12/2006, and that he will be due again 12/2016.  This is usually discussed/handled yearly at the AWV.  We will address this at his April visit (which is a med check, not his AWV), answer his questions and do referral then.  Please let him know that we will address it at his April visit.

## 2016-11-03 NOTE — Telephone Encounter (Signed)
Pt rcvd emmi  Call stating to call our office about colorectal cancer screening exam. Pt thinks his last colonoscopy was around 2007. Pt wants to know if he is truly due for colorectal cancer screening and if so, what he needs to do

## 2016-11-06 NOTE — Telephone Encounter (Signed)
Advised pt of Dr Knapp's instructions °

## 2016-12-05 NOTE — Progress Notes (Signed)
Chief Complaint  Patient presents with  . Hypertension    reports BP avg 120/70s.   . Diabetes    dry skin, checks glucose rarely. checked feet daily- no concerns. dry skin on fingers.    Using a cream he got in [place] which has helping his psoriasis.  His feet are still dry, but creams he is using has helped a lot--no longer has a thick build-up.  Hypertension follow-up: He was referred by Dr. Radford Pax to the HTN clinic after his stress test (and ambulatory monitor showed elevated BP's).  He was started on losartan 43m daily, and BP's improved.  He also had ETT and monitor as part of evaluation of chest discomfort and palpitations.  Doesn't seem to be having as much problems with chest/palpitations (was told it was fine, ignores it). BP's have been running 120's/70.  Denies dizziness, headaches. Denies any muscle cramps or spasms. Denies side effects of medications. Tries to follow a low sodium diet.  DM--hasn't been checking his sugars regularly. Checks only sporadically and can't recall the values. Lab Results  Component Value Date   HGBA1C 6.7 08/24/2016  A1c had jumped from 5.7 in October, to 6.7 in January, due to dietary noncompliance.  His diet has improved. No longer buying cookies or eating candy.  He is using his Nord[place]rack 45 minutes/day, while watching [employer].  He has been doing this daily for the last 6 days/week.  He is hoping to lose a little more weight before trying Silver Sneakers. He checks his feet regularly.  [place] exfoliate the dead skin and moisturizes.  He denies any burning, numbness or lesions. Eye exam is UTD, last was in October.  OSA:  Not using oral appliance due to ongoing dental work--it doesn't fit, and will need new one made once completed.  Anxiety: Paxil is helpful, keeps him calmer, no longer gets angry. Denies side effects and prefers to continue this.  He recalls getting irritable when he ran out of it in the  past.  Allergies:  Controlled with rhinocort (gets OTC), hasn't needed any antihistamines.  PMH, [place], [place] reviewed  Outpatient Encounter Prescriptions as of 12/06/2016  Medication Sig Note  . amLODipine (NORVASC) 10 MG tablet Take 1 tablet by mouth daily   . aspirin EC 81 MG tablet Take 81 mg by mouth daily.   .Marland Kitchenatenolol-chlorthalidone (TENORETIC) 50-25 MG tablet Take 1 tablet by mouth daily   . atorvastatin (LIPITOR) 40 MG tablet Take 1 tablet by mouth daily   . Blood Glucose Monitoring Suppl (ACCU-CHEK AVIVA PLUS) W/DEVICE KIT 1 each by Does not apply route 2 (two) times daily. 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 budesonide (RHINOCORT ALLERGY) 32 MCG/ACT nasal spray Place into both nostrils daily.   .Marland Kitchenglucose blood (ACCU-CHEK AVIVA PLUS) test strip Test twice daily   . LANCETS ULTRA THIN 30G MISC 1 each by Does not apply route 2 (two) times daily. E11.9-DX test twice daily   . losartan (COZAAR) 25 MG tablet 1 tablet daily.   . Multiple Vitamins-Minerals (PRESERVISION AREDS PO) Take 2 tablets by mouth daily.   . Omega-3 Fatty Acids (FISH OIL PO) Take 1 capsule by mouth daily.   .Marland KitchenPARoxetine (PAXIL) 40 MG tablet Take 1 tablet by mouth every morning   . potassium chloride SA (K-DUR,KLOR-CON) 20 MEQ tablet Take 2 tablets by mouth every day   . Propylene Glycol (SYSTANE BALANCE OP) Apply 2 drops to eye daily. 1  drop in each eye once a day.   . thiamine (VITAMIN B-1) 100 MG tablet Take 100 mg by mouth daily.   . clobetasol cream (TEMOVATE) 2.63 % Apply 1 application topically daily. Reported on 02/24/2016   . Dexlansoprazole (DEXILANT) 30 MG capsule Take 1 capsule (30 mg total) by mouth daily.   . metroNIDAZOLE (METROCREAM) 0.75 % cream Apply 1 application topically at bedtime. Reported on 02/24/2016 08/06/2014: Uses prn with flares  . promethazine-dextromethorphan (PROMETHAZINE-DM) 6.25-15 MG/5ML syrup Take 5 mLs by mouth 4 (four) times daily as needed for cough. (Patient not taking: Reported on  12/06/2016)   . [DISCONTINUED] losartan (COZAAR) 25 MG tablet Take 1 tablet (25 mg total) by mouth daily.    No facility-administered encounter medications on file as of 12/06/2016.    Allergies  Allergen Reactions  . Betadine [Povidone Iodine] Rash   ROS: no fever, chills, headaches, dizziness. No exertional chest pain or dyspnea, denies significant palpitations or tachycardia. No URI symptoms.  Allergies are controlled with rhinocort.  No GU, skin or joint complaints.  Moods are good.   PHYSICAL EXAM  BP 120/60   Pulse 60   Resp 16   Ht '5\' 6"'  (1.676 m)   Wt 235 lb 6.4 oz (106.8 kg)   SpO2 97%   BMI 37.99 kg/m   Wt Readings from Last 3 Encounters:  12/06/16 235 lb 6.4 oz (106.8 kg)  09/22/16 249 lb 6.4 oz (113.1 kg)  09/06/16 250 lb 6.4 oz (113.6 kg)    Well appearing, pleasant, obese male in no distress HEENT: conjunctiva and sclera are clear. OP clear. Nasal mucosa only mildly edematous. Neck: no lymphadenopathy or mass. No bruit Heart: regular rate and rhythm Lungs: clear bilaterally Back: no CVA tenderness Abdomen: obese, soft, nontender, no organomegaly or mass Extremities: no edema, 2+ pulses.  Dry skin at heels, very slight fissuring--overall improved.  Normal monofilament sensation. Psych: normal mood, affect, hygiene and grooming Neuro: alert and oriented, normal cranial nerves, strength, gait  A1c 5.9% today.   ASSESSMENT/PLAN:  Type 2 diabetes mellitus without complication, without long-term current use of insulin (HCC) - diet controlled--improved with better diet, daily exercise and weight loss. Past due for eye exam--advised to schedule and have them send report - Plan: HgB A1c, Comprehensive metabolic panel, TSH  Hypercholesteremia - Plan: Comprehensive metabolic panel, Lipid panel  Essential hypertension, benign - well controlled since addition of losartant by cardiologist/HTN clinic.  We can refill when needed in future - Plan: amLODipine (NORVASC) 10  MG tablet, atenolol-chlorthalidone (TENORETIC) 50-25 MG tablet, Comprehensive metabolic panel  Alcoholism in remission ([place])  OSA (obstructive sleep apnea) - continue weight loss; avoid sleeping on back.  To be re-fitted for oral appliance when dental work completed  Medication monitoring encounter - Plan: Comprehensive metabolic panel, Lipid panel, CBC with Differential/Platelet  Essential hypertension, benign - controlled - Plan: amLODipine (NORVASC) 10 MG tablet, atenolol-chlorthalidone (TENORETIC) 50-25 MG tablet, Comprehensive metabolic panel  Hypokalemia - Plan: potassium chloride SA (K-DUR,KLOR-CON) 20 MEQ tablet  Prostate cancer screening - risks/benefits of PSA screening reviewed.  exam to be done at physical in August, but yearly lab due now - Plan: PSA    shingrix recommended--risks/side effects reviewed. Encouraged to check with insurance and get from pharmacy.  Due for colonoscopy--to schedule with [facility].  He felt like he checked and was told he didn't need yet--recommended he double check- Please call [facility] to see if/when you are due for another colonoscopy.  f/u as scheduled in August for physical Needs MMSE at f/u. His father is in late stage dementia, and he is sometimes a little more forgetful.

## 2016-12-06 ENCOUNTER — Ambulatory Visit (INDEPENDENT_AMBULATORY_CARE_PROVIDER_SITE_OTHER): Payer: PPO | Admitting: Family Medicine

## 2016-12-06 ENCOUNTER — Telehealth: Payer: Self-pay | Admitting: Family Medicine

## 2016-12-06 ENCOUNTER — Encounter: Payer: Self-pay | Admitting: Family Medicine

## 2016-12-06 VITALS — BP 120/60 | HR 60 | Resp 16 | Ht 66.0 in | Wt 235.4 lb

## 2016-12-06 DIAGNOSIS — Z125 Encounter for screening for malignant neoplasm of prostate: Secondary | ICD-10-CM | POA: Diagnosis not present

## 2016-12-06 DIAGNOSIS — Z1211 Encounter for screening for malignant neoplasm of colon: Secondary | ICD-10-CM

## 2016-12-06 DIAGNOSIS — E876 Hypokalemia: Secondary | ICD-10-CM

## 2016-12-06 DIAGNOSIS — F1021 Alcohol dependence, in remission: Secondary | ICD-10-CM | POA: Diagnosis not present

## 2016-12-06 DIAGNOSIS — Z5181 Encounter for therapeutic drug level monitoring: Secondary | ICD-10-CM

## 2016-12-06 DIAGNOSIS — E119 Type 2 diabetes mellitus without complications: Secondary | ICD-10-CM

## 2016-12-06 DIAGNOSIS — I10 Essential (primary) hypertension: Secondary | ICD-10-CM

## 2016-12-06 DIAGNOSIS — G4733 Obstructive sleep apnea (adult) (pediatric): Secondary | ICD-10-CM | POA: Diagnosis not present

## 2016-12-06 DIAGNOSIS — E7800 Pure hypercholesterolemia, unspecified: Secondary | ICD-10-CM

## 2016-12-06 LAB — COMPREHENSIVE METABOLIC PANEL
ALK PHOS: 64 U/L (ref 40–115)
ALT: 31 U/L (ref 9–46)
AST: 23 U/L (ref 10–35)
Albumin: 4.4 g/dL (ref 3.6–5.1)
BILIRUBIN TOTAL: 0.9 mg/dL (ref 0.2–1.2)
BUN: 16 mg/dL (ref 7–25)
CALCIUM: 9.7 mg/dL (ref 8.6–10.3)
CO2: 25 mmol/L (ref 20–31)
Chloride: 102 mmol/L (ref 98–110)
Creat: 0.79 mg/dL (ref 0.70–1.25)
GLUCOSE: 122 mg/dL — AB (ref 65–99)
POTASSIUM: 3.8 mmol/L (ref 3.5–5.3)
Sodium: 139 mmol/L (ref 135–146)
Total Protein: 6.8 g/dL (ref 6.1–8.1)

## 2016-12-06 LAB — LIPID PANEL
CHOL/HDL RATIO: 2.4 ratio (ref <5.0)
CHOLESTEROL: 105 mg/dL (ref <200)
HDL: 43 mg/dL (ref >40)
LDL Cholesterol: 48 mg/dL (ref <100)
Triglycerides: 71 mg/dL (ref <150)
VLDL: 14 mg/dL (ref <30)

## 2016-12-06 LAB — CBC WITH DIFFERENTIAL/PLATELET
BASOS PCT: 0 %
Basophils Absolute: 0 cells/uL (ref 0–200)
Eosinophils Absolute: 140 cells/uL (ref 15–500)
Eosinophils Relative: 2 %
HEMATOCRIT: 46.5 % (ref 38.5–50.0)
Hemoglobin: 16.2 g/dL (ref 13.2–17.1)
LYMPHS ABS: 2520 {cells}/uL (ref 850–3900)
LYMPHS PCT: 36 %
MCH: 31.8 pg (ref 27.0–33.0)
MCHC: 34.8 g/dL (ref 32.0–36.0)
MCV: 91.2 fL (ref 80.0–100.0)
MONO ABS: 700 {cells}/uL (ref 200–950)
MPV: 9.4 fL (ref 7.5–12.5)
Monocytes Relative: 10 %
Neutro Abs: 3640 cells/uL (ref 1500–7800)
Neutrophils Relative %: 52 %
Platelets: 181 10*3/uL (ref 140–400)
RBC: 5.1 MIL/uL (ref 4.20–5.80)
RDW: 14.5 % (ref 11.0–15.0)
WBC: 7 10*3/uL (ref 4.0–10.5)

## 2016-12-06 LAB — POCT GLYCOSYLATED HEMOGLOBIN (HGB A1C): HEMOGLOBIN A1C: 5.9

## 2016-12-06 LAB — TSH: TSH: 2.86 mIU/L (ref 0.40–4.50)

## 2016-12-06 MED ORDER — POTASSIUM CHLORIDE CRYS ER 20 MEQ PO TBCR: 40.0000 meq | EXTENDED_RELEASE_TABLET | Freq: Every day | ORAL | 1 refills | Status: DC

## 2016-12-06 MED ORDER — AMLODIPINE BESYLATE 10 MG PO TABS: 10.0000 mg | ORAL_TABLET | Freq: Every day | ORAL | 1 refills | Status: DC

## 2016-12-06 MED ORDER — ATENOLOL-CHLORTHALIDONE 50-25 MG PO TABS: 1.0000 | ORAL_TABLET | Freq: Every day | ORAL | 1 refills | Status: DC

## 2016-12-06 NOTE — Telephone Encounter (Signed)
He needs to call [facility] to schedule.  Do referral, if one is needed by his insurance.

## 2016-12-06 NOTE — Telephone Encounter (Signed)
Pt states that he would rather see  [facility]. Order entered. He is advised that they will call him to schedule. Victorino December

## 2016-12-06 NOTE — Patient Instructions (Addendum)
Please call [facility] to see if/when you are due for another colonoscopy.  May will be 5 years from the last one, and originally they recommended repeat ing in 5 years.  Some of the guidelines have changed, so verify with them if you need to schedule one now, or if you are good for another 5 years.  I recommend getting the new shingles vaccine (Shingrix). You will need to check with your insurance to see if it is covered, and if covered by Medicare Part D, you need to get from the pharmacy rather than our office.  It is a series of 2 injections, spaced 2 months apart.  Congratulations on the regular exercise and weight loss.  Your sugars are under much better control.  Keep up the good work, there is still a ways to go.  Please let your eye doctor know that you have diabetes and remind him to send [place] a copy of your diabetic eye exam.

## 2016-12-06 NOTE — Telephone Encounter (Signed)
Pt called back and states that he had a colonoscopy 10 years ago and that it is time for another one, pt can be reached at [phone]

## 2016-12-07 ENCOUNTER — Telehealth: Payer: Self-pay

## 2016-12-07 ENCOUNTER — Encounter: Payer: Self-pay | Admitting: Family Medicine

## 2016-12-07 LAB — PSA: PSA: 2.3 ng/mL (ref <=4.0)

## 2016-12-07 NOTE — Telephone Encounter (Signed)
GI ordered corrected for [facility] as pt has been seen there before. Referral information faxed to their office for them to contact pt and schedule. PT is aware. Shannon Stone

## 2016-12-21 ENCOUNTER — Encounter: Payer: Self-pay | Admitting: Family Medicine

## 2017-01-29 DIAGNOSIS — K635 Polyp of colon: Secondary | ICD-10-CM | POA: Diagnosis not present

## 2017-01-29 DIAGNOSIS — D126 Benign neoplasm of colon, unspecified: Secondary | ICD-10-CM | POA: Diagnosis not present

## 2017-01-29 DIAGNOSIS — Z1211 Encounter for screening for malignant neoplasm of colon: Secondary | ICD-10-CM | POA: Diagnosis not present

## 2017-01-29 LAB — HM COLONOSCOPY

## 2017-02-01 DIAGNOSIS — K635 Polyp of colon: Secondary | ICD-10-CM | POA: Diagnosis not present

## 2017-02-01 DIAGNOSIS — D126 Benign neoplasm of colon, unspecified: Secondary | ICD-10-CM | POA: Diagnosis not present

## 2017-02-09 ENCOUNTER — Encounter: Payer: Self-pay | Admitting: Family Medicine

## 2017-02-26 ENCOUNTER — Other Ambulatory Visit: Payer: Self-pay | Admitting: Family Medicine

## 2017-02-28 ENCOUNTER — Telehealth: Payer: Self-pay | Admitting: Family Medicine

## 2017-02-28 MED ORDER — PAROXETINE HCL 40 MG PO TABS: 40.0000 mg | ORAL_TABLET | Freq: Every morning | ORAL | 0 refills | Status: DC

## 2017-02-28 NOTE — Telephone Encounter (Signed)
Pt called and states that he would like a 30 day supply of his paxil sent to the [facility], [place] - [place], [place] - [address]  he has not gotten his refill from the mail order yet and does not want to be out before the weekend, he would like to be called after It is sent in the pharmacy [phone]

## 2017-03-28 ENCOUNTER — Encounter: Payer: PPO | Admitting: Family Medicine

## 2017-03-29 NOTE — Progress Notes (Addendum)
Chief Complaint  Patient presents with  . Annual Exam    Fasting- reports that his father has dementia and he has been depressed about this recently. Last eye exam 9 months ago at [facility].     Shannon Stone is a 71 y.o. male who presents for annual physical exam, Medicare wellness visit and follow-up on chronic medical conditions.    He is reporting some worsening depression, related to his father's dementia. He also reports some memory concerns (forgets why he walked into a room).  He has been having some trouble falling asleep.  See depression and MMSE screens in epic (scores below). His father is in a nursing home in [place], and he visits him every 3 months.  Hypertension follow-up: He was referred by Dr. Radford Pax to the HTN clinic after his stress test (and ambulatory monitor showed elevated BP's).  He was started on losartan 97m daily, in addition to amlodipine and tenoretic, and BP's improved.  He had ETT and monitor as part of evaluation of chest discomfort and palpitations.  Doesn't seem to be having as much problems with chest/palpitations (was told it was fine, ignores it). Only occasionally does he notice any symptom. BP's have been running 120's/70.  Denies dizziness, headaches. Denies any muscle cramps or spasms. Denies side effects of medications. Tries to follow a low sodium diet.   Chemistry      Component Value Date/Time   NA 139 12/06/2016 0855   NA 141 10/03/2016 1123   K 3.8 12/06/2016 0855   CL 102 12/06/2016 0855   CO2 25 12/06/2016 0855   BUN 16 12/06/2016 0855   BUN 11 10/03/2016 1123   CREATININE 0.79 12/06/2016 0855      Component Value Date/Time   CALCIUM 9.7 12/06/2016 0855   ALKPHOS 64 12/06/2016 0855   AST 23 12/06/2016 0855   ALT 31 12/06/2016 0855   BILITOT 0.9 12/06/2016 0855     Hyperlipidemia follow-up: Patient is reportedly following a low-fat, low cholesterol diet. Compliant with medications and denies medication side effects.  Compliant with taking atorvastatin 437mdaily, and at goal on last check in April. Lab Results  Component Value Date   CHOL 105 12/06/2016   HDL 43 12/06/2016   LDLCALC 48 12/06/2016   TRIG 71 12/06/2016   CHOLHDL 2.4 12/06/2016    DM--hasn't been checking his sugars regularly. Checks only sporadically and can't recall the values. Lab Results  Component Value Date   HGBA1C 5.9 12/06/2016   A1c had jumped from 5.7 in October, to 6.7 in January, due to dietary noncompliance.  His diet has improved. No longer buying cookies or eating candy (just sugar-free cookies), and sugars improved.  He has lost weight, and exercising much more regularly. Plans to see Ju[place]ette [place]'t been using Nordictrack as regularly due to some cervical radiculopathy that sometimes flares when using it. He checks his feet regularly.  Creams he uses from [employer] exfoliate the dead skin and moisturizes.  He denies any burning, numbness or lesions. Eye exam is UTD, last was in October.  OSA:  Not using oral appliance due to ongoing dental work--it doesn't fit, and will need new one made once completed.  This should be completed soon.  Anxiety: Paxil is helpful, keeps him calmer, no longer gets angry. Denies side effects and prefers to continue this.  He recalls getting irritable when he ran out of it in the past. He has recently developed some depressive symptoms (  related to father's dementia, as stated above).  Allergies:  Controlled with rhinocort (gets OTC), hasn't needed any antihistamines.  Psoriasis: Uses steroid cream daily to affected areas (legs).  His feet are still dry, but creams he is using from [place] has helped a lot--no longer has a thick build-up.  Neck and back pain:  Previously under the care of Dr. Alfonso Ramus at [facility] (prior to that had injections from Dr. Brien Few). He completed PT over a year ago. He doesn't do home exercises. Recently, he has found that  doing yardwork causes radiculopathy to flare.  He has been having some arm/shoulder pain when on nordictrack also so he had to cut back on this (see below).  Denies weakness.   Stopped the Aleve due to risks of affects on liver/kidneys.  He had taken Aleve PM, felt groggy during the day, so not taking that either.   Immunization History  Administered Date(s) Administered  . Influenza Split 05/02/2012, 04/24/2013, 05/06/2015  . Influenza, High Dose Seasonal PF 05/07/2014  . Influenza-Unspecified 04/27/2016  . Pneumococcal Conjugate-13 05/07/2014  . Pneumococcal Polysaccharide-23 04/24/2013  . Td 08/15/2003  . Tdap 08/06/2013  . Zoster 11/12/2014   Last colonoscopy: 01/2017: tubular adenoma and hyperplastic polyp. F/U in 5 years (Dr. Starr Sinclair) Last PSA: 11/2016 Lab Results  Component Value Date   PSA 2.3 12/06/2016   PSA 3.32 11/17/2015   PSA 2.83 04/30/2014   Dentist: twice yearly (more frequently, having dentalwork) Ophtho: yearly in October Exercise: Nordictrack cut back to 20 minutes (from 45 min due to neck/arm pain), 3x/week. Negative hepatitis C screen 11/2015  Other doctors caring for patient include: Ophtho: [facility] Urology: Dr. Serita Butcher (not seen recently, but in past) Dermatologist: [facility] (in the past, none recent) GI: Dr. Amedeo Plenty ENT: Dr. Erik Obey Podiatrist: Dr. Paulla Dolly at [facility] (in the past) Dentist: Dr. Quincy Simmonds Ortho: Dr. Alfonso Ramus  Depression screen:  abnormal, PHQ-9 score of 7 Fall Screen: [place] off the last rung of a ladder (last Fall when cleaning leaves from gutter).  One slip in the house, one slip on the ice.  No injuries. Functional Status Screen: notable for mild short term memory concerns, unchanged from last year. 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 Right hearing loss, unchanged.  MMSE: 29/30 Needed hint for 1 of the recall. At first he added another intersecting pentagon to my  drawing.  But when given directions again, he drew it properly. Normal clock drawing.  End of Life Discussion:  Patient does not have a living will and medical power of attorney. He is requesting new forms.  Past Medical History:  Diagnosis Date  . Alcohol dependence ([place])    history of abuse/dependence-s/p inpatient rehab at [facility] 08/2008  . Anxiety   . Arthritis    OA-knees, elbow  . Brachial neuritis or radiculitis NOS 03/03/2014  . Complication of anesthesia    DIFFICULTY URINATING  . Depression    and anxiety  . Diabetes mellitus without complication ([place])    history of borderline DM/elevated fasting glucose.  . Diverticulosis    sigmoid(5/08)  . Dry eye   . Elevated LFTs    (ETOH)fatty infiltration on u/s  . Esophageal reflux   . Heart palpitations 09/06/2016  . 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 Hypertension   . Impotence of organic origin    erectile dysfunction--has seen Dr. Serita Butcher  . Macular degeneration   .  Microscopic hematuria    eval by Dr. Serita Butcher (pt reports h/o kidney stones)  . Neck pain    cervical radiculopathy-Dr. Sherley Bounds; persistent, s/p surgery  . Nocturia   . Other and unspecified hyperlipidemia    h/o elevated cholesterol  . Periodic limb movements of sleep    per sleep study 11/02  . Plantar fasciitis    treated by Dr. Paulla Dolly (injection)  . Psoriasis   . Rosacea   . 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 Sleep apnea    (done at [facility]); central and obstructive; using device from dentist  . Spinal stenosis in cervical region 03/03/14  . 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 Urgency of urination     Past Surgical History:  Procedure Laterality Date  . CERVICAL SPINE SURGERY  2011, 2013   Dr. Sherley Bounds  . KNEE SURGERY Left 2013  . POSTERIOR CERVICAL FUSION/FORAMINOTOMY  12/01/2011   Procedure: POSTERIOR CERVICAL FUSION/FORAMINOTOMY LEVEL 1;  Surgeon: Eustace Moore, MD;  Location: [facility];  Service: Neurosurgery;  Laterality: Bilateral;  Cervical five-six posterior cervical fusion   . UMBILICAL HERNIA REPAIR     AS CHILD     Social History   Social History  . Marital status: Single    Spouse name: N/A  . Number of children: 3  . Years of education: N/A   Occupational History  . retired Midwife)    Social History Main Topics  . Smoking status: Former Smoker    Packs/day: 1.00    Years: 18.00    Types: Cigarettes    Quit date: 08/14/1990  . Smokeless tobacco: Never Used  . Alcohol use No     Comment: zero drinks in 5 years.  sober since 08/26/2008  . Drug use: No  . Sexual activity: Not Currently   Other Topics Concern  . Not on file   Social History Narrative   Divorced.  Lives alone, 1 cat.  Retired Conservation officer, nature.  Alcoholic--sober since 09/5636.  Active in AA, daily meetings, and he volunteers at [employer] (where he did rehab), detox at [facility] and at the jail.  2 daughters and a son (1 in [place], 1 in [place], [place], 1 in [place], [place]). 5 grandchildren    Family History  Problem Relation Age of Onset  . Arthritis Mother   . Hypertension Mother   . Alcohol abuse Mother   . CVA Mother   . Hypertension Father   . Asthma Father   . Dementia Father   . Transient ischemic attack Father 49  . Cancer Paternal Grandfather        stomach  . Hodgkin's lymphoma Sister        history of  . Heart disease Sister        arrhythmia (required cardioversion)--age 59  . Cancer Maternal Uncle        stomach  . Parkinson's disease Paternal Grandmother   . Diabetes Other        amputee    Outpatient Encounter Prescriptions as of 03/30/2017  Medication Sig Note  . amLODipine (NORVASC) 10 MG tablet Take 1 tablet (10 mg total) by mouth daily.   Marland Kitchen aspirin EC 81 MG tablet Take 81 mg by mouth daily.   Marland Kitchen atenolol-chlorthalidone (TENORETIC) 50-25 MG tablet Take 1 tablet by mouth daily.   Marland Kitchen atorvastatin (LIPITOR) 40 MG  tablet Take 1 tablet by  mouth daily   . budesonide (RHINOCORT ALLERGY) 32 MCG/ACT nasal spray Place into both nostrils daily.   Marland Kitchen glucose blood (ACCU-CHEK AVIVA PLUS) test strip Test twice daily   . LANCETS ULTRA THIN 30G MISC 1 each by Does not apply route 2 (two) times daily. E11.9-DX test twice daily   . losartan (COZAAR) 25 MG tablet 1 tablet daily.   . Multiple Vitamins-Minerals (PRESERVISION AREDS PO) Take 2 tablets by mouth daily.   . Omega-3 Fatty Acids (FISH OIL PO) Take 1 capsule by mouth daily.   Marland Kitchen PARoxetine (PAXIL) 40 MG tablet Take 1 tablet (40 mg total) by mouth every morning.   . potassium chloride SA (K-DUR,KLOR-CON) 20 MEQ tablet Take 2 tablets (40 mEq total) by mouth daily.   Marland Kitchen Propylene Glycol (SYSTANE BALANCE OP) Apply 2 drops to eye daily. 1 drop in each eye once a day.   . thiamine (VITAMIN B-1) 100 MG tablet Take 100 mg by mouth daily.   . Blood Glucose Monitoring Suppl (ACCU-CHEK AVIVA PLUS) W/DEVICE KIT 1 each by Does not apply route 2 (two) times daily. 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 clobetasol cream (TEMOVATE) 3.54 % Apply 1 application topically daily. Reported on 02/24/2016   . metroNIDAZOLE (METROCREAM) 0.75 % cream Apply 1 application topically at bedtime. Reported on 02/24/2016 08/06/2014: Uses prn with flares  . [DISCONTINUED] promethazine-dextromethorphan (PROMETHAZINE-DM) 6.25-15 MG/5ML syrup Take 5 mLs by mouth 4 (four) times daily as needed for cough. (Patient not taking: Reported on 12/06/2016)    No facility-administered encounter medications on file as of 03/30/2017.     Allergies  Allergen Reactions  . Betadine [Povidone Iodine] Rash    ROS: The patient denies anorexia, fever, headaches, hoarseness, chest pain, palpitations, dizziness, syncope, dyspnea on exertion, cough, swelling, no melena, hematochezia, indigestion/heartburn, hematuria, dysuria, genital lesions, weakness, tremor, suspicious skin lesions (some rashes and dry skin related to  psoriasis; no rosacea flares), abnormal bleeding/bruising, or enlarged lymph nodes. No edema. Moods are good overall. 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 Some short-term memory concerns--these are intermittent, not progressive   PHYSICAL EXAM:  BP 122/76   Pulse (!) 52   Resp 18   Ht _0  (1.676 m)   Wt 231 lb 3.2 oz (104.9 kg)   SpO2 98%   BMI 37.32 kg/m   Wt Readings from Last 3 Encounters:  03/30/17 231 lb 3.2 oz (104.9 kg)  12/06/16 235 lb 6.4 oz (106.8 kg)  09/22/16 249 lb 6.4 oz (113.1 kg)    General Appearance:   Alert, cooperative, no distress, appears stated age  Head:   Normocephalic, without obvious abnormality, atraumatic  Eyes:   PERRL, conjunctiva/corneas clear, EOM's intact, fundi   benign  Ears:   Normal TM's and external ear canals  Nose:  Nares normal, mucosa is mildly edematous, no drainage or sinus tenderness  Throat:  Lips, mucosa, and tongue normal; teeth and gums normal  Neck:  Supple, no lymphadenopathy; thyroid: no enlargement/tenderness/nodules; no carotid bruit or JVD  Back:  Spine nontender, no curvature, ROM normal, no CVA tenderness  Lungs:   Clear to auscultation bilaterally without wheezes, rales or ronchi; respirations unlabored  Chest Wall:   No tenderness or deformity  Heart:   Regular rate (mild bradycardia) and rhythm, S1 and S2 normal, no murmur, rub or gallop  Breast Exam:   No chest wall tenderness, or masses  Abdomen:  Soft, non-tender, nondistended, active bowel sounds,   no masses, no hepatosplenomegaly. +abdominal obesity  Genitalia:   Normal male external genitalia without lesions. Testicles without masses, right larger than left. No inguinal hernias.  Rectal:   Normal sphincter tone.  Prostate is smooth, no nodules, no significant enlargement.  Heme  negative light brown stool.  No masses  Extremities:  No clubbing, cyanosis or edema. Dry calloused feet (heels)--decreased monofilament sensation only over the thickened heels.  Pulses:  2+ and symmetric all extremities  Skin:  Skin color, texture, turgor normal; no rashes/lesions noted.  Lymph nodes:  Cervical, supraclavicular, and axillary nodes normal  Neurologic:  CNII-XII intact, normal strength, sensation and gait; reflexes 2+ and symmetric throughout   Psych: Normal mood, affect, hygiene and grooming  Diabetic foot exam--decreased monofilament sensation over the calloused/dry/thickened skin of heels only  Lab Results  Component Value Date   HGBA1C 5.9 03/30/2017    ASSESSMENT/PLAN:  Annual physical exam - Plan: Urinalysis Dipstick  Medicare annual wellness visit, subsequent  Type 2 diabetes mellitus without complication, without long-term current use of insulin ([place]) - controlled - Plan: HgB A1c, Microalbumin / creatinine urine ratio  Hypercholesteremia - at goal on current regimen  Essential hypertension, benign - well controlled on current regimen  OSA (obstructive sleep apnea) - encouraged to get oral appliance adjusted once dentalwork completed, and continue weight loss  Cervical disc disorder with radiculopathy of cervical region - May resume Aleve BID (prev effective), but if using daily, discussed GI protection  Alcoholism in remission ([place])  Severe obesity (BMI 35.0-39.9) with comorbidity ([place]) - congratulated on weight loss so far, encouraged further  Anxiety state - controlled on paxil, continue  Adjustment disorder with depressed mood - Encouraged counseling, discussed resources available. Briefly discussed adding wellbutrin, declines. Cont Paxil   Urine microalbumin today No other labs needed, UTD, done in April.  A1c, c-met and lipids prior to November f/u.  Adjustment disorder, depressed  mood Counseling We briefly discussed wellbutrin--declines for now, but knows to contact us if not improving.  Recommended at least 30 minutes of aerobic activity at least 5 days/week, weight-bearing exercise 2x/wk; proper sunscreen use reviewed; healthy diet and alcohol recommendations (less than or equal to 2 drinks/day) reviewed; regular seatbelt use; changing batteries in smoke detectors. Self-testicular exams. Immunization recommendations discussed-- UTD, continue yearly high dose flu shots.Shingrix recommended. Colonoscopy recommendations reviewed--due again 01/2022.  MOST form reviewed and updated. Given forms for Living Will and Healthcare power of attorney, and encouraged him to return a copy to [place] once completed.  60 minute total visit time. Extensive counseling provided regarding medications, moods, diet, exercise (at least 15 mins)    I encourage you to contact [place] for counseling.  There are also many support groups in town for family members of those with dementia. You might want to check with the therapists for this information, or contact [facility] ([facility] for [facility]). We briefly discussed adjustments to medications (adding wellbutrin).  Please feel free to contact us if your moods are not improving with counseling alone.  You may resume taking Aleve twice daily to help with your neck and shoulder pain.  Take it with food.  You should take a prilosec OTC '20mg'$  once daily (or generic) or zantac or pepcid twice daily.  You need to take one of these stomach-protecting medications so that the Aleve doesn't cause ulcers or gastritis.  We will continue to continue to monitor your liver and kidney tests (which have been normal).  Medicare Attestation I have personally reviewed: The patient's medical and social history Their use of alcohol, tobacco or illicit drugs Their current medications and supplements The patient's functional ability including ADLs,fall risks, home  safety risks, cognitive, and hearing and visual impairment Diet and physical activities Evidence for depression or mood disorders  The patient's weight, height, and BMI have been recorded in the chart.  I have made referrals, counseling, and provided education to the patient based on review of the above and I have provided the patient with a written personalized care plan for preventive services.

## 2017-03-30 ENCOUNTER — Ambulatory Visit (INDEPENDENT_AMBULATORY_CARE_PROVIDER_SITE_OTHER): Payer: PPO | Admitting: Family Medicine

## 2017-03-30 ENCOUNTER — Encounter: Payer: Self-pay | Admitting: Family Medicine

## 2017-03-30 VITALS — BP 122/76 | HR 52 | Resp 18 | Ht 66.0 in | Wt 231.2 lb

## 2017-03-30 DIAGNOSIS — E119 Type 2 diabetes mellitus without complications: Secondary | ICD-10-CM | POA: Diagnosis not present

## 2017-03-30 DIAGNOSIS — M5010 Cervical disc disorder with radiculopathy, unspecified cervical region: Secondary | ICD-10-CM | POA: Diagnosis not present

## 2017-03-30 DIAGNOSIS — F4321 Adjustment disorder with depressed mood: Secondary | ICD-10-CM

## 2017-03-30 DIAGNOSIS — F1021 Alcohol dependence, in remission: Secondary | ICD-10-CM | POA: Diagnosis not present

## 2017-03-30 DIAGNOSIS — I10 Essential (primary) hypertension: Secondary | ICD-10-CM

## 2017-03-30 DIAGNOSIS — F411 Generalized anxiety disorder: Secondary | ICD-10-CM

## 2017-03-30 DIAGNOSIS — Z0000 Encounter for general adult medical examination without abnormal findings: Secondary | ICD-10-CM

## 2017-03-30 DIAGNOSIS — G4733 Obstructive sleep apnea (adult) (pediatric): Secondary | ICD-10-CM | POA: Diagnosis not present

## 2017-03-30 DIAGNOSIS — E7800 Pure hypercholesterolemia, unspecified: Secondary | ICD-10-CM

## 2017-03-30 LAB — POCT URINALYSIS DIPSTICK
Bilirubin, UA: NEGATIVE
Blood, UA: NEGATIVE
GLUCOSE UA: NEGATIVE
Ketones, UA: NEGATIVE
Leukocytes, UA: NEGATIVE
Nitrite, UA: NEGATIVE
PROTEIN UA: NEGATIVE
UROBILINOGEN UA: NEGATIVE U/dL — AB
pH, UA: 6 (ref 5.0–8.0)

## 2017-03-30 LAB — POCT GLYCOSYLATED HEMOGLOBIN (HGB A1C): Hemoglobin A1C: 5.9

## 2017-03-30 NOTE — Patient Instructions (Addendum)
HEALTH MAINTENANCE RECOMMENDATIONS:  It is recommended that you get at least 30 minutes of aerobic exercise at least 5 days/week (for weight loss, you may need as much as 60-90 minutes). This can be any activity that gets your heart rate up. This can be divided in 10-15 minute intervals if needed, but try and build up your endurance at least once a week.  Weight bearing exercise is also recommended twice weekly.  Eat a healthy diet with lots of vegetables, fruits and fiber.  "Colorful" foods have a lot of vitamins (ie green vegetables, tomatoes, red peppers, etc).  Limit sweet tea, regular sodas and alcoholic beverages, all of which has a lot of calories and sugar.  Up to 2 alcoholic drinks daily may be beneficial for men (unless trying to lose weight, watch sugars).  Drink a lot of water.  Sunscreen of at least SPF 30 should be used on all sun-exposed parts of the skin when outside between the hours of 10 am and 4 pm (not just when at beach or pool, but even with exercise, golf, tennis, and yard work!)  Use a sunscreen that says "broad spectrum" so it covers both UVA and UVB rays, and make sure to reapply every 1-2 hours.  Remember to change the batteries in your smoke detectors when changing your clock times in the spring and fall.  Use your seat belt every time you are in a car, and please drive safely and not be distracted with cell phones and texting while driving.   [name] , Thank you for taking time to come for your Medicare Wellness Visit. I appreciate your ongoing commitment to your health goals. Please review the following plan we discussed and let me know if I can assist you in the future.   These are the goals we discussed: Goals    None      This is a list of the screening recommended for you and due dates:  Health Maintenance  Topic Date Due  . Flu Shot  03/14/2017  . Hemoglobin A1C  06/07/2017  . Eye exam for diabetics  06/09/2017  . Complete foot exam   12/06/2017  .  Colon Cancer Screening  01/29/2022  . Tetanus Vaccine  08/07/2023  .  Hepatitis C: One time screening is recommended by Center for Disease Control  (CDC) for  adults born from [birth year] through 1964.   Completed  . Pneumonia vaccines  Completed    I encourage you to contact [place] for counseling.  There are also many support groups in town for family members of those with dementia. You might want to check with the therapists for this information, or contact [facility] (adult [facility] for [facility]). We briefly discussed adjustments to medications (adding wellbutrin).  Please feel free to contact us if your moods are not improving with counseling alone.   I recommend getting the new shingles vaccine (Shingrix). You will need to check with your insurance to see if it is covered, and if covered by Medicare Part D, you need to get from the pharmacy rather than our office.  It is a series of 2 injections, spaced 2 months apart.  There is a Producer, television/film/video, currently.  Hold off on starting the series until you see commercials on TV or until 2019, when it should be more readily available. You need to get both vaccines within a 6 month period.  You may resume taking Aleve twice daily to help with your neck and shoulder pain.  Take it with food.  You should take a prilosec OTC 20mg  once daily (or generic) or zantac or pepcid twice daily.  You need to take one of these stomach-protecting medications so that the Aleve doesn't cause ulcers or gastritis.  We will continue to continue to monitor your liver and kidney tests (which have been normal).

## 2017-03-31 LAB — MICROALBUMIN / CREATININE URINE RATIO
Creatinine, Urine: 143 mg/dL (ref 20–370)
MICROALB/CREAT RATIO: 4 ug/mg{creat} (ref <30)
Microalb, Ur: 0.6 mg/dL

## 2017-04-01 DIAGNOSIS — Z6841 Body Mass Index (BMI) 40.0 and over, adult: Secondary | ICD-10-CM

## 2017-06-03 ENCOUNTER — Other Ambulatory Visit: Payer: Self-pay | Admitting: Family Medicine

## 2017-06-11 DIAGNOSIS — H5203 Hypermetropia, bilateral: Secondary | ICD-10-CM | POA: Diagnosis not present

## 2017-06-11 DIAGNOSIS — E119 Type 2 diabetes mellitus without complications: Secondary | ICD-10-CM | POA: Diagnosis not present

## 2017-06-11 DIAGNOSIS — H353131 Nonexudative age-related macular degeneration, bilateral, early dry stage: Secondary | ICD-10-CM | POA: Diagnosis not present

## 2017-06-11 LAB — HM DIABETES EYE EXAM

## 2017-06-15 ENCOUNTER — Encounter: Payer: Self-pay | Admitting: Family Medicine

## 2017-06-18 ENCOUNTER — Other Ambulatory Visit: Payer: Self-pay | Admitting: Family Medicine

## 2017-06-18 DIAGNOSIS — I10 Essential (primary) hypertension: Secondary | ICD-10-CM

## 2017-06-18 DIAGNOSIS — E876 Hypokalemia: Secondary | ICD-10-CM

## 2017-06-28 ENCOUNTER — Other Ambulatory Visit: Payer: PPO

## 2017-06-28 DIAGNOSIS — E559 Vitamin D deficiency, unspecified: Secondary | ICD-10-CM | POA: Diagnosis not present

## 2017-06-28 DIAGNOSIS — I10 Essential (primary) hypertension: Secondary | ICD-10-CM | POA: Diagnosis not present

## 2017-06-28 DIAGNOSIS — E119 Type 2 diabetes mellitus without complications: Secondary | ICD-10-CM | POA: Diagnosis not present

## 2017-06-28 DIAGNOSIS — E7800 Pure hypercholesterolemia, unspecified: Secondary | ICD-10-CM | POA: Diagnosis not present

## 2017-06-29 LAB — COMPREHENSIVE METABOLIC PANEL
AG Ratio: 1.8 (calc) (ref 1.0–2.5)
ALKALINE PHOSPHATASE (APISO): 67 U/L (ref 40–115)
ALT: 29 U/L (ref 9–46)
AST: 20 U/L (ref 10–35)
Albumin: 4.1 g/dL (ref 3.6–5.1)
BILIRUBIN TOTAL: 0.8 mg/dL (ref 0.2–1.2)
BUN: 14 mg/dL (ref 7–25)
CALCIUM: 9.2 mg/dL (ref 8.6–10.3)
CO2: 28 mmol/L (ref 20–32)
Chloride: 103 mmol/L (ref 98–110)
Creat: 0.71 mg/dL (ref 0.70–1.18)
Globulin: 2.3 g/dL (calc) (ref 1.9–3.7)
Glucose, Bld: 152 mg/dL — ABNORMAL HIGH (ref 65–99)
POTASSIUM: 3.5 mmol/L (ref 3.5–5.3)
SODIUM: 140 mmol/L (ref 135–146)
TOTAL PROTEIN: 6.4 g/dL (ref 6.1–8.1)

## 2017-06-29 LAB — HEMOGLOBIN A1C
EAG (MMOL/L): 7.4 (calc)
Hgb A1c MFr Bld: 6.3 % of total Hgb — ABNORMAL HIGH (ref <5.7)
Mean Plasma Glucose: 134 (calc)

## 2017-06-29 LAB — LIPID PANEL
Cholesterol: 114 mg/dL (ref <200)
HDL: 46 mg/dL (ref >40)
LDL CHOLESTEROL (CALC): 52 mg/dL
Non-HDL Cholesterol (Calc): 68 mg/dL (calc) (ref <130)
TRIGLYCERIDES: 81 mg/dL (ref <150)
Total CHOL/HDL Ratio: 2.5 (calc) (ref <5.0)

## 2017-07-01 NOTE — Progress Notes (Signed)
Chief Complaint  Patient presents with  . Hypertension    nonfasting med check labs already done.    Patient presents for 3 month follow-up.  He realizes that he gained weight.  He hasn't yet joined [employer] on copays to go down in January, along with copay for therapist.  At his physical in August, he expressed feeling more depressed due to his father's worsening dementia. PHQ-9 score was 7. It was recommended he seek counseling through [facility], and consider caregiver support through [facility].  His father lives in [place] (with no other close family there), and was recently put in a nursing home.  He feels like he is doing much better, thinks the last time he was here he had residual "down in the dumps" feelings after his June trip.  He no longer feels down, feels somewhat resentful that he has to go and clean out his stuff, he has accumulated so much in the house. He is going again in December to see him, and continue to clean out his house. He remains on Paxil for anxiety, and anxiety is well controlled.  Cervical radiculopathy--at last visit advised to use Aleve prn. He never took any, still recalls when he was told not to take it (due to kidneys). When he pulled rope on the leafblower to start it yesterday, he had acute onset of pain in his right neck/shoulder, radiated to his throat and felt a little dizzy, slight nausea.  Today the pain is better, but his thumb is numb.  Had some pain last night off/on, relieved by repositioning himself.    Hypertension follow-up: He was referred by Dr. Radford Pax to the HTN clinic after his stress test (and ambulatory monitor showed elevated BP's). He was started on losartan 69m daily, in addition to amlodipine and tenoretic, and BP's improved. He had ETT and monitor as part of evaluation of chest discomfort and palpitations, which no longer bothers him. He no longer notices this. BP's have been running 120/70, only checks it sporadically.  Denies  dizziness, headaches. Denies any muscle cramps or spasms. Denies side effects of medications. Tries to follow a low sodium diet.  Hyperlipidemia follow-up: Patient is reportedly following a low cholesterol diet. Compliant with medications and denies medication side effects. Compliant with taking atorvastatin 477mdaily. He had labs prior to visit today, see below. Admits that his diet could be better.  Diabetes:  No longer buying cookies or eating candy (just sugar-free cookies).  But he does have cake and other sweets that are available at [employer] is only checking his sugars about once a month. He can't recall the values. He has gained 12# since his last visit. Plans to see JuJonette Mateor Silver Sneakers--waiting until January when insurance has some changes. Hasn't been using Nordictrack as regularly due to some cervical radiculopathy that sometimes flares when using it (pain and numbness right shoulder).  He can use it with just holding the handlebars, but hasn't been-- "I'm too lazy".  He checks his feet regularly. Creams he uses from [employer] exfoliate the dead skin and moisturizes. He denies any burning, numbness or lesions. Eye exam is UTD, last was in October 2018, no retinopathy.  Fingertips are very dry. 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 They are thick, and some cracking.  He avoids using alcohol based hand sanitizers.   PMH, PSH, SH reviewed  Outpatient Encounter Medications as of 07/02/2017  Medication Sig Note  . amLODipine (NORVASC)  10 MG tablet Take 1 tablet by mouth every day   . aspirin EC 81 MG tablet Take 81 mg by mouth daily.   Marland Kitchen atenolol-chlorthalidone (TENORETIC) 50-25 MG tablet Take 1 tablet by mouth every day   . atorvastatin (LIPITOR) 40 MG tablet Take 1 tablet by mouth daily   . Blood Glucose Monitoring Suppl (ACCU-CHEK AVIVA PLUS) W/DEVICE KIT 1 each by Does not apply route 2 (two) times daily. DX-E11.9 patient is to  test BID   . budesonide (RHINOCORT ALLERGY) 32 MCG/ACT nasal spray Place into both nostrils daily.   Marland Kitchen glucose blood (ACCU-CHEK AVIVA PLUS) test strip Test twice daily   . LANCETS ULTRA THIN 30G MISC 1 each by Does not apply route 2 (two) times daily. E11.9-DX test twice daily   . losartan (COZAAR) 25 MG tablet 1 tablet daily.   . Multiple Vitamins-Minerals (PRESERVISION AREDS PO) Take 2 tablets by mouth daily.   . Omega-3 Fatty Acids (FISH OIL PO) Take 1 capsule by mouth daily.   Marland Kitchen PARoxetine (PAXIL) 40 MG tablet Take 1 tablet by mouth every morning   . potassium chloride SA (K-DUR,KLOR-CON) 20 MEQ tablet Take 2 tablets by mouth every day   . Propylene Glycol (SYSTANE BALANCE OP) Apply 2 drops to eye daily. 1 drop in each eye once a day.   . thiamine (VITAMIN B-1) 100 MG tablet Take 100 mg by mouth daily.   . clobetasol cream (TEMOVATE) 2.23 % Apply 1 application topically daily. Reported on 02/24/2016   . metroNIDAZOLE (METROCREAM) 0.75 % cream Apply 1 application topically at bedtime. Reported on 02/24/2016 08/06/2014: Uses prn with flares   No facility-administered encounter medications on file as of 07/02/2017.    Allergies  Allergen Reactions  . Betadine [Povidone Iodine] Rash   ROS: no fever, chills, headaches, dizziness, chest pain, palpitations, URI symptoms, shortness of breath, heartburn, vomiting, bowel changes.  +neck pain, short-lived nausea yesterday and right thumb numbness per HPI. Gained 12# since last visit.  PHYSICAL EXAM:  BP 110/60   Pulse 60   Ht '5\' 6"'  (1.676 m)   Wt 243 lb 9.6 oz (110.5 kg) Comment: would not take shoes off  BMI 39.32 kg/m   Wt Readings from Last 3 Encounters:  07/02/17 243 lb 9.6 oz (110.5 kg)  03/30/17 231 lb 3.2 oz (104.9 kg)  12/06/16 235 lb 6.4 oz (106.8 kg)   Pleasant, morbidly obese (abdominal obesity) male, in no distress HEENT: conjunctiva and sclera are clear, OP clear. Neck: no lymphadenopathy, thyromegaly, bruit or cervical  spine tenderness.  WHSS Heart: regular rate and rhythm Lungs: clear bilaterally Back: no spinal or CVA tenderness Extremities no edea, 2+ pulses.  Very thickened skin at most finger tips, a few small fissures. Neuro: alert and oriented, brisk reflexes, subjective decrease sensation right thumb, intact to light touch Psych: normal mood, affect, hygiene and grooming  Lab Results  Component Value Date   HGBA1C 6.3 (H) 06/28/2017  (increased from 5.9 at last his visit 3 months ago).  Fasting glucose 152   Chemistry      Component Value Date/Time   NA 140 06/28/2017 0850   NA 141 10/03/2016 1123   K 3.5 06/28/2017 0850   CL 103 06/28/2017 0850   CO2 28 06/28/2017 0850   BUN 14 06/28/2017 0850   BUN 11 10/03/2016 1123   CREATININE 0.71 06/28/2017 0850      Component Value Date/Time   CALCIUM 9.2 06/28/2017 0850   ALKPHOS 64 12/06/2016  0855   AST 20 06/28/2017 0850   ALT 29 06/28/2017 0850   BILITOT 0.8 06/28/2017 0850     Lab Results  Component Value Date   CHOL 114 06/28/2017   HDL 46 06/28/2017   LDLCALC 48 12/06/2016   TRIG 81 06/28/2017   CHOLHDL 2.5 06/28/2017   ASSESSMENT/PLAN:  Type 2 diabetes mellitus without complication, without long-term current use of insulin (HCC) - diet controlled worsening with weight gain. Discussed metformin, declines. Counseled re: diet, exercise at length  Hypercholesteremia - at goal, continue statin  Essential hypertension, benign - well controlled on current regimen.  Doesn't plan to f/u with HTN clinic, I will refill his ARB along with other BP meds  Cervical disc disorder with radiculopathy of cervical region - discussed safety/risks of NSAIDs and recommend short-term use to help with inflammation.   Severe obesity (BMI 35.0-39.9) with comorbidity ([place]) - counseled extensively re: risks of obesity. Counseled re: diet, exercise  Adjustment disorder with depressed mood - significantly improved.  will have ongoing stressors related  to aging parent in another country.  Will pursue counseling in January.  Briefly discussed psych meds--if ongoing/worsening pain, consider switch to cymbalta.  Overall doing better (moods) and pain not terrible, so continue paxil for now.   F/u 3 mos, A1c at visit (at that visit, schedule the f/u due in May with labs prior--TSH, CBC, c-met, lipids, A1c, PSA) and CPE    You may use ibuprofen 868m every 8 hours (three times/day) OR 2 Aleve twice daily (not both), taken with food, for up to 10-14 days.  Do not take it long-term (worry about the kidneys), don't take it on any empty stomach (worry about the stomach)--if you develop any stomach pain, cut the dose in half or stop taking it.   Please get back on the Nordictrak.  Do not use your arms if that bothers your neck/shoulder, but you need to get back into the regular cardio routine.  Minimum 30 minutes daily.  I recommend that you apply eucerin ointment or vaseline to your hands at bedtime, and wear cotton gloves on top.  Moisturize your hands as frequently as possible, definitely after you wash your hands.  Consider a trial of lachydrin to see if this helps on the very thickened portion of the skin (amlactin may be the other name). You can also see the dermatologist to see what recommendations/prescriptions are needed.  Your sugars and weight are back up.  Please check the sugars more frequently (at least once daily, either fasting, in the morning, or at bedtime).  Keep a log and bring it to your next visit.  Normal fasting sugar is <100, goal is <130 to be "in control".  2 hours after a meal it can be up to 140.  We would like to see it <160.    I have a low threshold to start you on metformin for your diabetes.  I would much rather you get the sugar and weight down by improving your diet and exercising daily, rather than start a medication.

## 2017-07-02 ENCOUNTER — Ambulatory Visit: Payer: PPO | Admitting: Family Medicine

## 2017-07-02 ENCOUNTER — Encounter: Payer: Self-pay | Admitting: Family Medicine

## 2017-07-02 VITALS — BP 110/60 | HR 60 | Ht 66.0 in | Wt 243.6 lb

## 2017-07-02 DIAGNOSIS — E119 Type 2 diabetes mellitus without complications: Secondary | ICD-10-CM

## 2017-07-02 DIAGNOSIS — M5010 Cervical disc disorder with radiculopathy, unspecified cervical region: Secondary | ICD-10-CM | POA: Diagnosis not present

## 2017-07-02 DIAGNOSIS — I10 Essential (primary) hypertension: Secondary | ICD-10-CM

## 2017-07-02 DIAGNOSIS — F4321 Adjustment disorder with depressed mood: Secondary | ICD-10-CM | POA: Diagnosis not present

## 2017-07-02 DIAGNOSIS — E7800 Pure hypercholesterolemia, unspecified: Secondary | ICD-10-CM

## 2017-07-02 NOTE — Patient Instructions (Signed)
  You may use ibuprofen 800mg  every 8 hours (three times/day) OR 2 Aleve twice daily (not both), taken with food, for up to 10-14 days.  Do not take it long-term (worry about the kidneys), don't take it on any empty stomach (worry about the stomach)--if you develop any stomach pain, cut the dose in half or stop taking it.   Please get back on the Nordictrak.  Do not use your arms if that bothers your neck/shoulder, but you need to get back into the regular cardio routine.  Minimum 30 minutes daily.  I recommend that you apply eucerin ointment or vaseline to your hands at bedtime, and wear cotton gloves on top.  Moisturize your hands as frequently as possible, definitely after you wash your hands.  Consider a trial of lachydrin to see if this helps on the very thickened portion of the skin (amlactin may be the other name). You can also see the dermatologist to see what recommendations/prescriptions are needed.  Your sugars and weight are back up.  Please check the sugars more frequently (at least once daily, either fasting, in the morning, or at bedtime).  Keep a log and bring it to your next visit.  Normal fasting sugar is <100, goal is <130 to be "in control".  2 hours after a meal it can be up to 140.  We would like to see it <160.    I have a low threshold to start you on metformin for your diabetes.  I would much rather you get the sugar and weight down by improving your diet and exercising daily, rather than start a medication.

## 2017-07-03 ENCOUNTER — Encounter: Payer: Self-pay | Admitting: Family Medicine

## 2017-08-30 ENCOUNTER — Other Ambulatory Visit: Payer: Self-pay | Admitting: Family Medicine

## 2017-10-02 NOTE — Progress Notes (Signed)
Chief Complaint  Patient presents with  . Diabetes    nonfasting med check. Left leg pain from time to time only when he is lying at night.     Patient presents for 3 month f/u.  Has some pain in his left leg at night (the top leg, not the one he is laying on), not every night. No pain during the day.  Only hurts at night. No numbness, tingling or associated back pain. Pain is in the upper calf, and feels better if he moves it, doesn't need to get out of bed.  Diabetes: He has never taken medications for his diabetes. He reports he has been watching his carbs and sweets, but not watching his weight as carefully. He ran out of test strips, so hasn't checked sugars. He thinks sugar will be better today, due to this dietary improvement. Per pt--Only 1 bagel/day, no other breads, eats lots of greens. He does grab a cookie at [employer], but cut back from 3.  He hasn't been getting any exercise, due to the weather.  Hasn't been using his NordicTrak. Plans to see Jonette Mate for Silver Sneakers--he had been waiting until January when insurance has some changes, but hasn't gone yet. He learned that this will be free (at [employer] or another place on [address]), and plans to look into.  At his last visit he reported that he hadn't been using Nordictrack as regularly due to some cervical radiculopathy that sometimes flares when using it (pain and numbness right shoulder). His shoulder hasn't been bothering him recently, so that is no longer the excuse. He just got out of the routine, and hasn't used it.  He checks his feet regularly. Creams he uses from [place] helps exfoliate the dead skin and moisturizes, and he reports it looks much better. He denies any burning, numbness or lesions. Eye exam is UTD, last was in October 2018, no retinopathy. Lab Results  Component Value Date   HGBA1C 6.3 (H) 06/28/2017   Moods--worse at his recent physical, due to aging father in [place], with dementia.   Previously discussed counseling, was waiting until January. He visited his father before Christmas (looked good in facility, but roof leaking at his home, cleaned out a lot of things, which made him feel good).  Plans to go back in 3 weeks.  He got guardianship to sell assets, if needed to pay for his care. Moods have been good, doesn't feel that counseling is needed at this point.  Hypertension follow-up: He was referred by Dr. Radford Pax to the HTN clinic after his stress test (and ambulatory monitor showed elevated BP's). He was started on losartan 48m daily,in addition to amlodipine and tenoretic,and BP's improved. He had ETT and monitor as part of evaluation of chest discomfort and palpitations, which no longer bothers him. He no longer notices this. BP's have been running 120/60's, only checks it about once a week.  Denies dizziness, headaches. Denies side effects of medications. Tries to follow a low sodium diet.  Hyperlipidemia follow-up: Patient is reportedly following a low cholesterol diet. Compliant with medications and denies medication side effects. Compliant with taking atorvastatin 465mdaily.  Lab Results  Component Value Date   CHOL 114 06/28/2017   HDL 46 06/28/2017   LDLCALC 48 12/06/2016   TRIG 81 06/28/2017   CHOLHDL 2.5 06/28/2017  LDL 52 in 06/2017  PMH, PSH, SH reviewed  Outpatient Encounter Medications as of 10/03/2017  Medication Sig Note  . amLODipine (NORVASC) 10  MG tablet Take 1 tablet (10 mg total) by mouth daily.   Marland Kitchen aspirin EC 81 MG tablet Take 81 mg by mouth daily.   Marland Kitchen atenolol-chlorthalidone (TENORETIC) 50-25 MG tablet Take 1 tablet by mouth daily.   Marland Kitchen atorvastatin (LIPITOR) 40 MG tablet Take 1 tablet (40 mg total) by mouth daily.   . Blood Glucose Monitoring Suppl (ACCU-CHEK AVIVA PLUS) W/DEVICE KIT 1 each by Does not apply route 2 (two) times daily. 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 budesonide (RHINOCORT ALLERGY) 32 MCG/ACT nasal spray Place into  both nostrils daily.   Marland Kitchen glucose blood (ACCU-CHEK AVIVA PLUS) test strip Test twice daily   . LANCETS ULTRA THIN 30G MISC 1 each by Does not apply route 2 (two) times daily. E11.9-DX test twice daily   . losartan (COZAAR) 25 MG tablet Take 1 tablet (25 mg total) by mouth daily.   . Multiple Vitamins-Minerals (PRESERVISION AREDS PO) Take 2 tablets by mouth daily.   Marland Kitchen PARoxetine (PAXIL) 40 MG tablet Take 1 tablet (40 mg total) by mouth every morning.   . potassium chloride SA (K-DUR,KLOR-CON) 20 MEQ tablet Take 2 tablets (40 mEq total) by mouth daily.   Marland Kitchen Propylene Glycol (SYSTANE BALANCE OP) Apply 2 drops to eye daily. 1 drop in each eye once a day.   . thiamine (VITAMIN B-1) 100 MG tablet Take 100 mg by mouth daily.   . [DISCONTINUED] amLODipine (NORVASC) 10 MG tablet Take 1 tablet by mouth every day   . [DISCONTINUED] atenolol-chlorthalidone (TENORETIC) 50-25 MG tablet Take 1 tablet by mouth every day   . [DISCONTINUED] atorvastatin (LIPITOR) 40 MG tablet Take 1 tablet by mouth daily   . [DISCONTINUED] losartan (COZAAR) 25 MG tablet 1 tablet daily.   . [DISCONTINUED] PARoxetine (PAXIL) 40 MG tablet Take 1 tablet by mouth every morning   . [DISCONTINUED] potassium chloride SA (K-DUR,KLOR-CON) 20 MEQ tablet Take 2 tablets by mouth every day   . Dulaglutide (TRULICITY) 8.14 GY/1.8HU SOPN Inject 0.75 mg into the skin once a week.   . [DISCONTINUED] clobetasol cream (TEMOVATE) 3.14 % Apply 1 application topically daily. Reported on 02/24/2016   . [DISCONTINUED] metroNIDAZOLE (METROCREAM) 0.75 % cream Apply 1 application topically at bedtime. Reported on 02/24/2016 08/06/2014: Uses prn with flares  . [DISCONTINUED] Omega-3 Fatty Acids (FISH OIL PO) Take 1 capsule by mouth daily.    No facility-administered encounter medications on file as of 10/03/2017.    (Trulicity started today, not taking prior to visit)  Allergies  Allergen Reactions  . Betadine [Povidone Iodine] Rash    ROS:  No fever,  chills, URI symptoms, headaches, dizziness, chest pain, shortness of breath, GI or GU complaints. Some left leg cramps as per HPI.  Shoulder/neck hasn't been bothering him. No weight changes. Moods have been good.  No bleeding, bruising, rash, skin lesions or other concerns. See HPI   PHYSICAL EXAM:  BP 126/70   Pulse 64   Ht _0  (1.676 m)   Wt 244 lb (110.7 kg)   BMI 39.38 kg/m   Wt Readings from Last 3 Encounters:  10/03/17 244 lb (110.7 kg)  07/02/17 243 lb 9.6 oz (110.5 kg)  03/30/17 231 lb 3.2 oz (104.9 kg)    Pleasant, morbidly obese (abdominal obesity) male, in no distress HEENT: conjunctiva and sclera are clear, OP clear. Neck: no lymphadenopathy, thyromegaly, or bruit Heart: regular rate and rhythm Lungs: clear bilaterally Back: no spinal or CVA tenderness Extremities no edema, 2+ pulses.  Neuro: alert and oriented, cranial nerves intact, normal gait Psych: normal mood, affect, hygiene and grooming   Lab Results  Component Value Date   HGBA1C 6.6 10/03/2017    ASSESSMENT/PLAN:  Type 2 diabetes mellitus without complication, without long-term current use of insulin (HCC) - A1c rising, despite diet changes; no exercise or wt loss. Start Trulicity to help with sugar and weight loss. Sample given, shown how to use, SE reviewed - Plan: HgB A1c, Dulaglutide (TRULICITY) 2.41 ZB/3.0ZU SOPN  Essential hypertension, benign - controlled - Plan: amLODipine (NORVASC) 10 MG tablet, atenolol-chlorthalidone (TENORETIC) 50-25 MG tablet, losartan (COZAAR) 25 MG tablet  Severe obesity (BMI 35.0-39.9) with comorbidity ([place]) - counseled re: diet, exercise, portions. Hope to see weight loss with adding Trulicity for DM. To start [place] and rec home exercise also  Hypercholesteremia - at goal; continue statin - Plan: atorvastatin (LIPITOR) 40 MG tablet  Hypokalemia - Plan: potassium chloride SA (K-DUR,KLOR-CON) 20 MEQ tablet  Generalized anxiety disorder -  doing well, continue paxil - Plan: PARoxetine (PAXIL) 40 MG tablet    f/u in May (3 mos) with labs prior--TSH, CBC, c-met, lipids, A1c, PSA) and CPE due in August  Sample given of Trulicity (2 pens)--given dose with one to show how to use. Risks/side effects reviewed in detail. Likely can stay on lower dose due to DM already being well controlled. Consider increasing if no nausea, if needed.  He is not on metformin, but likely doesn't need both and I'd rather jumpstart his weight loss with the Trulicity at this point.

## 2017-10-03 ENCOUNTER — Other Ambulatory Visit: Payer: Self-pay | Admitting: Cardiology

## 2017-10-03 ENCOUNTER — Other Ambulatory Visit: Payer: Self-pay | Admitting: Family Medicine

## 2017-10-03 ENCOUNTER — Encounter: Payer: Self-pay | Admitting: Family Medicine

## 2017-10-03 ENCOUNTER — Ambulatory Visit (INDEPENDENT_AMBULATORY_CARE_PROVIDER_SITE_OTHER): Payer: PPO | Admitting: Family Medicine

## 2017-10-03 VITALS — BP 126/70 | HR 64 | Ht 66.0 in | Wt 244.0 lb

## 2017-10-03 DIAGNOSIS — E876 Hypokalemia: Secondary | ICD-10-CM

## 2017-10-03 DIAGNOSIS — I10 Essential (primary) hypertension: Secondary | ICD-10-CM

## 2017-10-03 DIAGNOSIS — E119 Type 2 diabetes mellitus without complications: Secondary | ICD-10-CM | POA: Diagnosis not present

## 2017-10-03 DIAGNOSIS — Z5181 Encounter for therapeutic drug level monitoring: Secondary | ICD-10-CM

## 2017-10-03 DIAGNOSIS — F411 Generalized anxiety disorder: Secondary | ICD-10-CM

## 2017-10-03 DIAGNOSIS — E7800 Pure hypercholesterolemia, unspecified: Secondary | ICD-10-CM

## 2017-10-03 DIAGNOSIS — Z125 Encounter for screening for malignant neoplasm of prostate: Secondary | ICD-10-CM | POA: Diagnosis not present

## 2017-10-03 LAB — POCT GLYCOSYLATED HEMOGLOBIN (HGB A1C): Hemoglobin A1C: 6.6

## 2017-10-03 MED ORDER — ATENOLOL-CHLORTHALIDONE 50-25 MG PO TABS: 1.0000 | ORAL_TABLET | Freq: Every day | ORAL | 1 refills | Status: DC

## 2017-10-03 MED ORDER — PAROXETINE HCL 40 MG PO TABS: 40.0000 mg | ORAL_TABLET | Freq: Every morning | ORAL | 1 refills | Status: DC

## 2017-10-03 MED ORDER — ATORVASTATIN CALCIUM 40 MG PO TABS: 40.0000 mg | ORAL_TABLET | Freq: Every day | ORAL | 1 refills | Status: DC

## 2017-10-03 MED ORDER — LOSARTAN POTASSIUM 25 MG PO TABS: 25.0000 mg | ORAL_TABLET | Freq: Every day | ORAL | 1 refills | Status: DC

## 2017-10-03 MED ORDER — DULAGLUTIDE 0.75 MG/0.5ML ~~LOC~~ SOAJ: 0.7500 mg | SUBCUTANEOUS | 2 refills | Status: DC

## 2017-10-03 MED ORDER — AMLODIPINE BESYLATE 10 MG PO TABS: 10.0000 mg | ORAL_TABLET | Freq: Every day | ORAL | 1 refills | Status: DC

## 2017-10-03 MED ORDER — POTASSIUM CHLORIDE CRYS ER 20 MEQ PO TBCR: 40.0000 meq | EXTENDED_RELEASE_TABLET | Freq: Every day | ORAL | 1 refills | Status: DC

## 2017-10-03 NOTE — Telephone Encounter (Signed)
Order Providers   Prescribing Provider Encounter Provider  Rita Ohara, MD Rita Ohara, MD  Medication Detail    Disp Refills Start End   losartan (COZAAR) 25 MG tablet 90 tablet 1 10/03/2017    Sig - Route: Take 1 tablet (25 mg total) by mouth daily. - Oral   Sent to pharmacy as: losartan (COZAAR) 25 MG tablet   E-Prescribing Status: Receipt confirmed by pharmacy (10/03/2017 2:43 PM EST)   Associated Diagnoses   Essential hypertension, benign - [place], [place]

## 2017-10-03 NOTE — Patient Instructions (Signed)
Start checking sugars regularly--sometimes in the morning before food, and occasionally in the evening (at least 2 hours after eating/bedtime).  Write these down, and bring the list to your next visit. We are starting the weekly injections to help bring your sugar down, and hopefully also help with some weight loss. Be sure to have small meals more frequently, rather than very large meals in order to decrease the potential nausea from the medication.  Call to arrange for the Silver Sneaker program, and do that 3 times/week. Try and get back on the Nordictrak at least 2x per week as well.  (goal is minimum 150  Minutes/week of cardio).  Continue all of your other medications.

## 2017-10-04 ENCOUNTER — Other Ambulatory Visit: Payer: Self-pay | Admitting: *Deleted

## 2017-10-04 DIAGNOSIS — E119 Type 2 diabetes mellitus without complications: Secondary | ICD-10-CM

## 2017-10-04 MED ORDER — GLUCOSE BLOOD VI STRP: ORAL_STRIP | 5 refills | Status: DC

## 2017-10-04 MED ORDER — LANCETS ULTRA THIN 30G MISC: 1.0000 | Freq: Two times a day (BID) | 5 refills | Status: DC

## 2017-10-05 ENCOUNTER — Other Ambulatory Visit: Payer: Self-pay | Admitting: *Deleted

## 2017-10-05 DIAGNOSIS — E119 Type 2 diabetes mellitus without complications: Secondary | ICD-10-CM

## 2017-10-05 MED ORDER — BLOOD GLUCOSE MONITOR KIT: 1.0000 | PACK | Freq: Every day | 0 refills | Status: DC | PRN

## 2017-10-05 MED ORDER — GLUCOSE BLOOD VI STRP: 1.0000 | ORAL_STRIP | 5 refills | Status: DC | PRN

## 2017-10-05 MED ORDER — ONETOUCH DELICA LANCETS 33G MISC: 1.0000 | Freq: Two times a day (BID) | 5 refills | Status: AC

## 2017-10-10 ENCOUNTER — Telehealth: Payer: Self-pay

## 2017-10-10 ENCOUNTER — Other Ambulatory Visit: Payer: Self-pay | Admitting: *Deleted

## 2017-10-10 MED ORDER — BLOOD GLUCOSE MONITOR KIT: 1.0000 | PACK | Freq: Every day | 0 refills | Status: DC | PRN

## 2017-10-10 NOTE — Telephone Encounter (Signed)
Ok for rx?

## 2017-10-10 NOTE — Telephone Encounter (Signed)
Done

## 2017-10-10 NOTE — Telephone Encounter (Signed)
Pt pharmacy is requesting a script for One touch meter. Their number is [phone] and fax is [phone]. Thanks [employer]

## 2017-12-03 ENCOUNTER — Other Ambulatory Visit: Payer: Self-pay | Admitting: Family Medicine

## 2017-12-03 DIAGNOSIS — E119 Type 2 diabetes mellitus without complications: Secondary | ICD-10-CM

## 2017-12-06 ENCOUNTER — Other Ambulatory Visit: Payer: Self-pay | Admitting: Family Medicine

## 2017-12-06 DIAGNOSIS — E119 Type 2 diabetes mellitus without complications: Secondary | ICD-10-CM

## 2017-12-06 MED ORDER — DULAGLUTIDE 0.75 MG/0.5ML ~~LOC~~ SOAJ: 0.7500 mg | SUBCUTANEOUS | 0 refills | Status: DC

## 2017-12-06 NOTE — Telephone Encounter (Signed)
Pt called and is requesting a refill on his trulicity pt uses [facility] - [place], [place] pt can be reached at [phone]

## 2017-12-06 NOTE — Telephone Encounter (Signed)
He likely will be staying on the lower strength (if he stays on med). He has f/u in a month--unsure what plan will be, depending on his weight, sugars, how he is tolerating (very unusual that I elected to start this as initial therapy, he doesn't take metformin, likely would ADD metformin, vs switch meds entirely, depending on what's going on, but not likely to increase dose).  Not sure if he prefers a 1 month local supply until that f/u visit, and if he stays on it, then send in 3 month supply?  Check with patient. There is a chance we might change therapy at his f/u next month.,

## 2017-12-06 NOTE — Telephone Encounter (Signed)
Is this okay to refill? Is he supposed to stay on this strength?

## 2017-12-06 NOTE — Telephone Encounter (Signed)
Patient stated even if Dr.Knapp increases he still wants me to send a 3 month of this medication to his mail order today.

## 2017-12-07 ENCOUNTER — Telehealth: Payer: Self-pay

## 2017-12-07 ENCOUNTER — Telehealth: Payer: Self-pay | Admitting: Family Medicine

## 2017-12-07 NOTE — Telephone Encounter (Signed)
Advise pt--I believe the current recommendations are that people born before 1956 (which includes this patient) are thought to have "presumptive" immunity to the virus.  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

## 2017-12-07 NOTE — Telephone Encounter (Signed)
Called and left message for pt that he did not need the booster

## 2017-12-07 NOTE — Telephone Encounter (Signed)
Pt called stating that Trulicity is $364 at the pharmacy and he would like something else to be sent in. Please advise.

## 2017-12-07 NOTE — Telephone Encounter (Signed)
Pt called and is wanting to know if he needs to have the measles shot since there is a break out of them he does not want to get them, he does not think he had is has kid, pt can be reached at [phone] please advise pt is aware that you are not in the office today

## 2017-12-24 ENCOUNTER — Other Ambulatory Visit: Payer: PPO

## 2017-12-24 DIAGNOSIS — E119 Type 2 diabetes mellitus without complications: Secondary | ICD-10-CM

## 2017-12-24 DIAGNOSIS — Z125 Encounter for screening for malignant neoplasm of prostate: Secondary | ICD-10-CM

## 2017-12-24 DIAGNOSIS — E7800 Pure hypercholesterolemia, unspecified: Secondary | ICD-10-CM | POA: Diagnosis not present

## 2017-12-24 DIAGNOSIS — E876 Hypokalemia: Secondary | ICD-10-CM

## 2017-12-24 DIAGNOSIS — Z5181 Encounter for therapeutic drug level monitoring: Secondary | ICD-10-CM

## 2017-12-24 DIAGNOSIS — I10 Essential (primary) hypertension: Secondary | ICD-10-CM | POA: Diagnosis not present

## 2017-12-25 LAB — COMPREHENSIVE METABOLIC PANEL
A/G RATIO: 1.8 (ref 1.2–2.2)
ALK PHOS: 81 IU/L (ref 39–117)
ALT: 32 IU/L (ref 0–44)
AST: 18 IU/L (ref 0–40)
Albumin: 4.3 g/dL (ref 3.5–4.8)
BILIRUBIN TOTAL: 0.7 mg/dL (ref 0.0–1.2)
BUN / CREAT RATIO: 14 (ref 10–24)
BUN: 10 mg/dL (ref 8–27)
CHLORIDE: 101 mmol/L (ref 96–106)
CO2: 25 mmol/L (ref 20–29)
Calcium: 8.9 mg/dL (ref 8.6–10.2)
Creatinine, Ser: 0.74 mg/dL — ABNORMAL LOW (ref 0.76–1.27)
GFR calc Af Amer: 108 mL/min/{1.73_m2} (ref >59)
GFR calc non Af Amer: 93 mL/min/{1.73_m2} (ref >59)
GLUCOSE: 161 mg/dL — AB (ref 65–99)
Globulin, Total: 2.4 g/dL (ref 1.5–4.5)
Potassium: 3.4 mmol/L — ABNORMAL LOW (ref 3.5–5.2)
Sodium: 143 mmol/L (ref 134–144)
Total Protein: 6.7 g/dL (ref 6.0–8.5)

## 2017-12-25 LAB — CBC WITH DIFFERENTIAL/PLATELET
Basophils Absolute: 0 10*3/uL (ref 0.0–0.2)
Basos: 0 %
EOS (ABSOLUTE): 0.3 10*3/uL (ref 0.0–0.4)
EOS: 4 %
HEMATOCRIT: 44.5 % (ref 37.5–51.0)
HEMOGLOBIN: 15.4 g/dL (ref 13.0–17.7)
IMMATURE GRANS (ABS): 0 10*3/uL (ref 0.0–0.1)
Immature Granulocytes: 0 %
LYMPHS ABS: 2.1 10*3/uL (ref 0.7–3.1)
LYMPHS: 23 %
MCH: 31.5 pg (ref 26.6–33.0)
MCHC: 34.6 g/dL (ref 31.5–35.7)
MCV: 91 fL (ref 79–97)
MONOCYTES: 8 %
Monocytes Absolute: 0.8 10*3/uL (ref 0.1–0.9)
NEUTROS ABS: 5.8 10*3/uL (ref 1.4–7.0)
Neutrophils: 65 %
Platelets: 188 10*3/uL (ref 150–379)
RBC: 4.89 x10E6/uL (ref 4.14–5.80)
RDW: 14.7 % (ref 12.3–15.4)
WBC: 8.9 10*3/uL (ref 3.4–10.8)

## 2017-12-25 LAB — TSH: TSH: 3.03 u[IU]/mL (ref 0.450–4.500)

## 2017-12-25 LAB — LIPID PANEL
Chol/HDL Ratio: 2.4 ratio (ref 0.0–5.0)
Cholesterol, Total: 94 mg/dL — ABNORMAL LOW (ref 100–199)
HDL: 39 mg/dL — ABNORMAL LOW (ref >39)
LDL CALC: 44 mg/dL (ref 0–99)
Triglycerides: 53 mg/dL (ref 0–149)
VLDL Cholesterol Cal: 11 mg/dL (ref 5–40)

## 2017-12-25 LAB — HEMOGLOBIN A1C
Est. average glucose Bld gHb Est-mCnc: 140 mg/dL
Hgb A1c MFr Bld: 6.5 % — ABNORMAL HIGH (ref 4.8–5.6)

## 2017-12-25 LAB — PSA: Prostate Specific Ag, Serum: 3.6 ng/mL (ref 0.0–4.0)

## 2017-12-29 NOTE — Progress Notes (Signed)
Chief Complaint  Patient presents with  . Hypertension    nonfasting med check.     Pt called on 4/26 stating that Trulicity is $416 at the pharmacy and he would like something else to be sent in. Unfortunately, this message was never routed to me, so I did not get it to address it. He was started on this at his last visit, 2/20 due to his A1c rising, despite diet changes; no exercise or wt loss. Hoping to help with sugar and weight loss.  He has never been on metformin or other medications.  He had labs done prior to visit, see below.  Blood sugars:  He only checks his sugars once a month, recalls 130's.  He has test strips, a new monitor and [employer] sent him a chart to complete.  He feels like he has been good with his diet, and was very surprised to hear that he gained weight today (doesn't weigh self at home).    Eats 1 bagel/day or raisin bran cereal for breakfast, no other breads; eats lots of greens. He no longer eats any cookies or cakes. He has been eating pasta, rice, potatoes.  He hadn't gotten any exercise--only started last week. He hurt his back moving a picnic table last week, which again limits him. Admits that he didn't get any exercise until last week since his last visit. Never looked into [employer] as he has been saying he would for the last 6 months.. He finds he gets on his computer, and doesn't do other things he should be doing.  Hypertension follow-up: He continues on losartan 98m daily,in addition to amlodipine and tenoretic,and BP's are 130/60's at home, checking it 3x/week. Denies dizziness, headaches. Denies side effects of medications. Tries to follow a low sodium diet.  Hyperlipidemia follow-up: Patient is reportedly following a low cholesterol diet. Compliant with medications and denies medication side effects. Compliant with taking atorvastatin 448mdaily.   He reports he had another shingles vaccine (believes Zostavax)  [place] (he was trying to get the shingrix that was recommended for him to get from pharmacy here)  [place]   Outpatient Encounter Medications as of 12/31/2017  Medication Sig  . amLODipine (NORVASC) 10 MG tablet Take 1 tablet (10 mg total) by mouth daily.  . Marland Kitchenspirin EC 81 MG tablet Take 81 mg by mouth daily.  . Marland Kitchentenolol-chlorthalidone (TENORETIC) 50-25 MG tablet Take 1 tablet by mouth daily.  . Marland Kitchentorvastatin (LIPITOR) 40 MG tablet Take 1 tablet (40 mg total) by mouth daily.  . blood glucose meter kit and supplies KIT 1 each by Does not apply route daily as needed.  . budesonide (RHINOCORT ALLERGY) 32 MCG/ACT nasal spray Place into both nostrils daily.  . Marland Kitchenlucose blood test strip 1 each by Other route as needed for other. Use as instructed  . LANCETS ULTRA THIN 30G MISC 1 each by Does not apply route 2 (two) times daily. E11.9-DX test twice daily  . losartan (COZAAR) 25 MG tablet Take 1 tablet (25 mg total) by mouth daily.  . Multiple Vitamins-Minerals (PRESERVISION AREDS PO) Take 2 tablets by mouth daily.  . Glory RosebushELICA LANCETS 3338GISC 1 each by Does not apply route 2 (two) times daily.  . Marland KitchenARoxetine (PAXIL) 40 MG tablet Take 1 tablet (40 mg total) by mouth every morning.  . potassium chloride SA (K-DUR,KLOR-CON) 20 MEQ tablet Take 2 tablets (40 mEq total) by mouth daily.  . Marland Kitchenropylene Glycol (  SYSTANE BALANCE OP) Apply 2 drops to eye daily. 1 drop in each eye once a day.  . thiamine (VITAMIN B-1) 100 MG tablet Take 100 mg by mouth daily.  . [DISCONTINUED] Dulaglutide (TRULICITY) 0.09 FG/1.8EX SOPN Inject 0.75 mg into the skin once a week. (Patient not taking: Reported on 12/31/2017)   No facility-administered encounter medications on file as of 12/31/2017.    Taking B complex and Zinc   Allergies  Allergen Reactions  . Betadine [Povidone Iodine] Rash   ROS:  No fever, chills, headaches, dizziness, chest pain, shortness of breath, GI or GU complaints,  rash, bleeding, bruising, joint pains.  +back pain, left side, recent.  Denies numbness/tingling/weakness, urinary complaints or other concerns. Moods are good.  PHYSICAL EXAM:  BP 130/70   Pulse 60   Ht '5\' 6"'  (1.676 m)   Wt 250 lb (113.4 kg)   BMI 40.35 kg/m   Wt Readings from Last 3 Encounters:  12/31/17 250 lb (113.4 kg)  10/03/17 244 lb (110.7 kg)  07/02/17 243 lb 9.6 oz (110.5 kg)    Pleasant, morbidly obese (abdominal obesity) male, in no distress HEENT: conjunctiva and sclera are clear, OP clear. Neck: no lymphadenopathy, thyromegaly, or bruit Heart: regular rate and rhythm Lungs: clear bilaterally Abdomen: obese, soft, nontender, no mass Back: no spinal or CVA tenderness. Tender at left upper lumbar/loer thoracic paraspinous muscles, mild spasm. Extremities no edema, 2+ pulses.  Neuro: alert and oriented, cranial nerves intact, normal gait Psych: normal mood, affect, hygiene and grooming Skin: excoriations on RLE (where he scratched mosquito bites); dried blood present. No swelling, crusting.   Lab Results  Component Value Date   HGBA1C 6.5 (H) 12/24/2017     Chemistry      Component Value Date/Time   NA 143 12/24/2017 0823   K 3.4 (L) 12/24/2017 0823   CL 101 12/24/2017 0823   CO2 25 12/24/2017 0823   BUN 10 12/24/2017 0823   CREATININE 0.74 (L) 12/24/2017 0823   CREATININE 0.71 06/28/2017 0850      Component Value Date/Time   CALCIUM 8.9 12/24/2017 0823   ALKPHOS 81 12/24/2017 0823   AST 18 12/24/2017 0823   ALT 32 12/24/2017 0823   BILITOT 0.7 12/24/2017 0823     Fasting glu 161  Lab Results  Component Value Date   CHOL 94 (L) 12/24/2017   HDL 39 (L) 12/24/2017   LDLCALC 44 12/24/2017   TRIG 53 12/24/2017   CHOLHDL 2.4 12/24/2017   Lab Results  Component Value Date   WBC 8.9 12/24/2017   HGB 15.4 12/24/2017   HCT 44.5 12/24/2017   MCV 91 12/24/2017   PLT 188 12/24/2017    Lab Results  Component Value Date   TSH 3.030 12/24/2017    PSA 3.6 Lab Results  Component Value Date   PSA 2.3 12/06/2016   PSA 3.32 11/17/2015   PSA 2.83 04/30/2014    ASSESSMENT/PLAN:  Type 2 diabetes mellitus without complication, without long-term current use of insulin ([place]) - Start metformin. Risks/side effects reviewed. Diet/exercise reviewed in detail, risks of diabetes - Plan: metFORMIN (GLUCOPHAGE-XR) 500 MG 24 hr tablet  Essential hypertension, benign - continue current regimen  Hypercholesteremia - continue statin  Severe obesity (BMI 35.0-39.9) with comorbidity ([place]) - counseled at length re: diet/exercise/portions/weight loss. Consider [employer]. Rec weekly weights  Hypokalemia - mild--continue K supplement and K-rich foods. Recheck at [facility] Metformin ER 563m once daily for a week, then increase to 2 daily  if tolerated.   F/u as scheduled for annual exam in 3 mos.   Let us know your immunizations--there was no need for another Zostavax. Shingrix is the recommended shingles vaccine.  Your potassium was a little low. Be sure to take 2/day (no need to separate).  Let us know if you are having any muscle cramps.  We will recheck next time. Your sugar was 161 the day of your last labs--this is much higher than you report. It is very important that you check your sugars daily. Bring your list of sugars to your next visit here. Daily exercise will help bring the sugars down.  Please either get on your Nordictrak or do [employer]--whatever you prefer to get 150 minutes of cardio each week to decrease the risk for cardiovascular disease. Cut back on portions of carbs in your diet (1/4 plate), on portions in general, in order to help with weight loss.

## 2017-12-31 ENCOUNTER — Encounter: Payer: Self-pay | Admitting: Family Medicine

## 2017-12-31 ENCOUNTER — Ambulatory Visit (INDEPENDENT_AMBULATORY_CARE_PROVIDER_SITE_OTHER): Payer: PPO | Admitting: Family Medicine

## 2017-12-31 VITALS — BP 130/70 | HR 60 | Ht 66.0 in | Wt 250.0 lb

## 2017-12-31 DIAGNOSIS — E876 Hypokalemia: Secondary | ICD-10-CM

## 2017-12-31 DIAGNOSIS — E119 Type 2 diabetes mellitus without complications: Secondary | ICD-10-CM

## 2017-12-31 DIAGNOSIS — I10 Essential (primary) hypertension: Secondary | ICD-10-CM

## 2017-12-31 DIAGNOSIS — E7800 Pure hypercholesterolemia, unspecified: Secondary | ICD-10-CM | POA: Diagnosis not present

## 2017-12-31 MED ORDER — METFORMIN HCL ER 500 MG PO TB24: ORAL_TABLET | ORAL | 2 refills | Status: DC

## 2017-12-31 NOTE — Patient Instructions (Addendum)
Start Metformin ER 500mg  once daily for a week, then increase to 2 daily if tolerated. Take it before breakfast. If you have loose stools, metamucil daily can help.  Let us know your immunizations--there was no need for another Zostavax. Shingrix is the recommended shingles vaccine.  Your potassium was a little low. Be sure to take 2/day (no need to separate).  Let us know if you are having any muscle cramps.  We will recheck next time. Your sugar was 161 the day of your last labs--this is much higher than you report. It is very important that you check your sugars daily. Bring your list of sugars to your next visit here. Daily exercise will help bring the sugars down.  Please either get on your Nordictrak or do [employer]--whatever you prefer to get 150 minutes of cardio each week to decrease the risk for cardiovascular disease. Cut back on portions of carbs in your diet (1/4 plate), on portions in general, in order to help with weight loss.   MyPlate from USDA The general, healthful diet is based on the 2010 Dietary Guidelines for Americans. The amount of food you need to eat from each food group depends on your age, sex, and level of physical activity and can be individualized by a dietitian. Go to [email or web address] for more information. What do I need to know about the MyPlate plan?  Enjoy your food, but eat less.  Avoid oversized portions. ?  of your plate should include fruits and vegetables. ?  of your plate should be grains. ?  of your plate should be protein. Grains  Make at least half of your grains whole grains.  For a 2,000 calorie daily food plan, eat 6 oz every day.  1 oz is about 1 slice bread, 1 cup cereal, or  cup cooked rice, cereal, or pasta. Vegetables  Make half your plate fruits and vegetables.  For a 2,000 calorie daily food plan, eat 2 cups every day.  1 cup is about 1 cup raw or cooked vegetables or vegetable juice or 2 cups raw leafy  greens. Fruits  Make half your plate fruits and vegetables.  For a 2,000 calorie daily food plan, eat 2 cups every day.  1 cup is about 1 cup fruit or 100% fruit juice or  cup dried fruit. Protein  For a 2,000 calorie daily food plan, eat 5 oz every day.  1 oz is about 1 oz meat, poultry, or fish,  cup cooked beans, 1 egg, 1 Tbsp peanut butter, or  oz nuts or seeds. Dairy  Switch to fat-free or low-fat (1%) milk.  For a 2,000 calorie daily food plan, eat 3 cups every day.  1 cup is about 1 cup milk or yogurt or soy milk (soy beverage), 1 oz natural cheese, or 2 oz processed cheese. Fats, Oils, and Empty Calories  Only small amounts of oils are recommended.  Empty calories are calories from solid fats or added sugars.  Compare sodium in foods like soup, bread, and frozen meals. Choose the foods with lower numbers.  Drink water instead of sugary drinks. What foods can I eat? Grains Whole grains such as whole wheat, quinoa, millet, and bulgur. Bread, rolls, and pasta made from whole grains. Brown or wild rice. Hot or cold cereals made from whole grains and without added sugar. Vegetables All fresh vegetables, especially fresh red, dark green, or orange vegetables. Peas and beans. Low-sodium frozen or canned vegetables prepared without added salt. Low-sodium vegetable  juices. Fruits All fresh, frozen, and dried fruits. Canned fruit packed in water or fruit juice without added sugar. Fruit juices without added sugar. Meats and Other Protein Sources Boiled, baked, or grilled lean meat trimmed of fat. Skinless poultry. Fresh seafood and shellfish. Canned seafood packed in water. Unsalted nuts and unsalted nut butters. Tofu. Dried beans and pea. Eggs. Dairy Low-fat or fat-free milk, yogurt, and cheeses. Sweets and Desserts Frozen desserts made from low-fat milk. Fats and Oils Olive, peanut, and canola oils and margarine. Salad dressing and mayonnaise made from these  oils. Other Soups and casseroles made from allowed ingredients and without added fat or salt. The items listed above may not be a complete list of recommended foods or beverages. Contact your dietitian for more options. What foods are not recommended? Grains Sweetened, low-fiber cereals. Packaged baked goods. Snack crackers and chips. Cheese crackers, butter crackers, and biscuits. Frozen waffles, sweet breads, doughnuts, pastries, packaged baking mixes, pancakes, cakes, and cookies. Vegetables Regular canned or frozen vegetables or vegetables prepared with salt. Canned tomatoes. Canned tomato sauce. Fried vegetables. Vegetables in cream sauce or cheese sauce. Fruits Fruits packed in syrup or made with added sugar. Meats and Other Protein Sources Marbled or fatty meats such as ribs. Poultry with skin. Fried meats, poultry, eggs, or fish. Sausages, hot dogs, and deli meats such as pastrami, bologna, or salami. Dairy Whole milk, cream, cheeses made from whole milk, sour cream. Ice cream or yogurt made from whole milk or with added sugar. Beverages For adults, no more than one alcoholic drink per day. Regular soft drinks or other sugary beverages. Juice drinks. Sweets and Desserts Sugary or fatty desserts, candy, and other sweets. Fats and Oils Solid shortening or partially hydrogenated oils. Solid margarine. Margarine that contains trans fats. Butter. The items listed above may not be a complete list of foods and beverages to avoid. Contact your dietitian for more information. This information is not intended to replace advice given to you by your health care provider. Make sure you discuss any questions you have with your health care provider. Document Released: 08/20/2007 Document Revised: 01/06/2016 Document Reviewed: 07/09/2013 Elsevier Interactive Patient Education  [employer].

## 2018-02-21 ENCOUNTER — Other Ambulatory Visit: Payer: Self-pay | Admitting: Family Medicine

## 2018-02-21 DIAGNOSIS — E119 Type 2 diabetes mellitus without complications: Secondary | ICD-10-CM

## 2018-02-25 ENCOUNTER — Telehealth: Payer: Self-pay | Admitting: Family Medicine

## 2018-02-25 DIAGNOSIS — E119 Type 2 diabetes mellitus without complications: Secondary | ICD-10-CM

## 2018-02-25 MED ORDER — METFORMIN HCL ER 500 MG PO TB24: ORAL_TABLET | ORAL | 0 refills | Status: DC

## 2018-02-25 NOTE — Telephone Encounter (Signed)
Pt called and needs his metformin switched from the [facility] to the [place], [place] pt can be reached at [phone]

## 2018-02-25 NOTE — Telephone Encounter (Signed)
Done

## 2018-03-27 ENCOUNTER — Other Ambulatory Visit: Payer: Self-pay | Admitting: Family Medicine

## 2018-03-27 DIAGNOSIS — F411 Generalized anxiety disorder: Secondary | ICD-10-CM

## 2018-03-27 DIAGNOSIS — I10 Essential (primary) hypertension: Secondary | ICD-10-CM

## 2018-03-27 DIAGNOSIS — E876 Hypokalemia: Secondary | ICD-10-CM

## 2018-03-27 DIAGNOSIS — E7800 Pure hypercholesterolemia, unspecified: Secondary | ICD-10-CM

## 2018-03-27 NOTE — Progress Notes (Signed)
Chief Complaint  Patient presents with  . Medicare Wellness    fasting AWV/CPE. Had recent eye exam. His hand and feet crack at the same time, this is a new finding. Someone from his ins called him and told him that he should get off of paxil due to his age.    Shannon Stone is a 72 y.o. male who presents for annual wellness visit and follow-up on chronic medical conditions.   Diabetes:  He was started on metformin 3 months ago, now tolerating 2/d without side effects. Sugars have not been checked--he only got the new lancets this past week. He stopped eating cookies 3 months ago--stopped buying them (only because the price went up), and no longer eating at [employer], eating a meal prior to going. Diabetic eye exam 05/2017; recently noted a change in his vision (related to macular degen) and saw eye doctor recently. No treatment was needed, but keeping an eye on it. Checks feet regularly without concerns/lesions/numbness Lab Results  Component Value Date   HGBA1C 6.5 (H) 12/24/2017   Hypertension follow-up: He continues on losartan 70m daily,in addition to amlodipine and tenoretic,and BP's are 110-130/65-75 at home. Denies dizziness, headaches. Denies side effects of medications. Tries to follow a low sodium diet.  He was noted to have K+ 3.4 on last check 3 months ago. Reports compliance with potassium supplement, due for recheck today. No muscle cramps, but having some pain down his left side (left/hip) when in bed at night, wakes up with it, improves with position change.  Hyperlipidemia follow-up: Patient is reportedly following a low cholesterol diet. Compliant with medications and denies medication side effects. Compliant with taking atorvastatin 463mdaily.  Lab Results  Component Value Date   CHOL 94 (L) 12/24/2017   HDL 39 (L) 12/24/2017   LDLCALC 44 12/24/2017   TRIG 53 12/24/2017   CHOLHDL 2.4 12/24/2017   OSA: He hadn't been using oral appliance due to dental  work--it doesn't fit, and will need new one made once completed. He finished the dental work recently, and can now get another appliance made.  Anxiety: Paxil is helpful, keeps him calmer, no longer gets angry. Denies side effects and prefers to continue this. He recalls getting irritable when he ran out of it in the past. He had some depressive symptoms last year, related to father's dementia, improved. Plans to visit his dad in 2 weeks (some days he remembers seeing him, sometimes not, when he visits). He is asking about risks with Paxil, as he prefers to stay on it. Sister was diagnosed with lung cancer. Reports moods are good.  Allergies: Controlled with rhinocort (gets OTC), hasn't needed any antihistamines. Plans to restart the rhinocort in September, not needing now.  Psoriasis: Uses steroid cream prn to affected areas (legs). His feet are still dry, but creams [place] using from [place] helped a lot. Rash flaring on his hands/fingers, only stings some when it gets under the nail. He notices that his feet break out when his hands break out also. Patch of psoriasis on his right calf; elbows and knees have cleared up. No longer sees dermatologist.  Neck and back pain:  [facility] Dr[facility] He completed PT over 2 years ago. He doesn't do home exercises. Last year he noted that doing yardwork caused radiculopathy to flare.  "same as it always has been"--tingling down right arm into the right index  finger and thumb.  Only rarely hurts, mostly numbness/tingling. Denies weakness. Wakes up sometimes with pain down the left leg occasionally, relieved by position change.  Denies back pain.  Immunization History  Administered Date(s) Administered  . Influenza Split 05/02/2012, 04/24/2013, 05/06/2015  . Influenza, High Dose Seasonal PF 05/07/2014, 05/31/2017  . Influenza-Unspecified 04/27/2016  . Pneumococcal  Conjugate-13 05/07/2014  . Pneumococcal Polysaccharide-23 04/24/2013  . Td 08/15/2003  . Tdap 08/06/2013  . Zoster 11/12/2014   He reports he had another shingles vaccine (believes Zostavax) while in [place] recently (he was trying to get the shingrix that was recommended for him to get from pharmacy here)--he will try and verify which shot exactly he got when he returns soon.  Last colonoscopy: 01/2017: tubular adenoma and hyperplastic polyp. F/U in 5 years (Dr. Amedeo Plenty, [place]) Last PSA:  12/2017 PSA 3.6 Lab Results  Component Value Date   PSA 2.3 12/06/2016   PSA 3.32 11/17/2015   PSA 2.83 04/30/2014   Dentist: twice yearly  Ophtho: yearly in October (and recently) Exercise: "virtually none" Negative hepatitis C screen 11/2015  Other doctors caring for patient include: [facility]ho: in [facility] Urology: Dr. Serita Butcher (not seen recently, but in past) Dermatologist: [facility] (in the past, none recent) GI: Dr. Amedeo Plenty ENT: Dr. Erik Obey Podiatrist: Dr. Paulla Dolly at [facility] (in the past) Dentist: Dr. Quincy Simmonds Ortho: Dr. 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 Has urgency. Right tinnitus and hearing loss, unchanged. Mini-Cog screen normal (5)   End of Life Discussion: Patient does not havea living will and medical power of attorney. He filled out forms from last year, still needs to get it notarized.  Past Medical History:  Diagnosis Date  . Alcohol dependence ([place])    history of abuse/dependence-s/p [facility] 08/[facility]  . Anxiety   . Arthritis    OA-knees, elbow  . Brachial neuritis or radiculitis NOS 03/03/2014  . Complication of anesthesia    DIFFICULTY URINATING  . Depression    and anxiety  . Diabetes mellitus without complication ([place])     history of borderline DM/elevated fasting glucose.  . Diverticulosis    sigmoid(5/08)  . Dry eye   . Elevated LFTs    (ETOH)fatty infiltration on u/s  . Esophageal reflux   . Heart palpitations 09/06/2016  . 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 Hypertension   . Impotence of organic origin    erectile dysfunction--has seen Dr. Serita Butcher  . Macular degeneration   . Microscopic hematuria    eval by Dr. Serita Butcher (pt reports h/o kidney stones)  . Neck pain    cervical radiculopathy-Dr. Sherley Bounds; persistent, s/p surgery  . Nocturia   . Other and unspecified hyperlipidemia    h/o elevated cholesterol  . Periodic limb movements of sleep    per sleep study 11/02  . Plantar fasciitis    treated by Dr. Paulla Dolly (injection)  . Psoriasis   . Rosacea   . 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 Sleep apnea    (done at [facility]); central and obstructive; using device from dentist  . Spinal stenosis in cervical region 03/03/14  . 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 Urgency of urination  Past Surgical History:  Procedure Laterality Date  . CERVICAL SPINE SURGERY  2011, 2013   Dr. Sherley Bounds  . KNEE SURGERY Left 2013  . POSTERIOR CERVICAL FUSION/FORAMINOTOMY  12/01/2011   Procedure: POSTERIOR CERVICAL FUSION/FORAMINOTOMY LEVEL 1;  Surgeon: Eustace Moore, MD;  Location: [place] [facility];  Service: Neurosurgery;  Laterality: Bilateral;  Cervical five-six posterior cervical fusion   . UMBILICAL HERNIA REPAIR     AS CHILD     Social History   Socioeconomic History  . Marital status: Single    Spouse name: Not on file  . Number of children: 3  . Years of education: Not on file  . Highest education level: Not on file  Occupational History  . Occupation: retired Midwife)  Social Needs  . Financial resource strain: Not on file  . Food insecurity:    Worry: Not on file    Inability: Not on file  .  Transportation needs:    Medical: Not on file    Non-medical: Not on file  Tobacco Use  . Smoking status: Former Smoker    Packs/day: 1.00    Years: 18.00    Pack years: 18.00    Types: Cigarettes    Last attempt to quit: 08/14/1990    Years since quitting: 27.6  . Smokeless tobacco: Never Used  Substance and Sexual Activity  . Alcohol use: No    Comment: zero drinks in 5 years.  sober since 08/26/2008  . Drug use: No  . Sexual activity: Not Currently  Lifestyle  . Physical activity:    Days per week: Not on file    Minutes per session: Not on file  . Stress: Not on file  Relationships  . Social connections:    Talks on phone: Not on file    Gets together: Not on file    Attends religious service: Not on file    Active member of club or organization: Not on file    Attends meetings of clubs or organizations: Not on file    Relationship status: Not on file  . Intimate partner violence:    Fear of current or ex partner: Not on file    Emotionally abused: Not on file    Physically abused: Not on file    Forced sexual activity: Not on file  Other Topics Concern  . Not on file  Social History Narrative   Divorced.  Lives alone, 1 cat.  Retired Conservation officer, nature.  Alcoholic--sober since 08/4429.  Active in AA, daily meetings, and he volunteers at [employer] (where he did rehab), detox at [facility] and at the jail.  2 daughters and a son (1 in [place], 1 in [place], [place], 1 in [place], [place]). 5 grandchildren.   Dad is in [place], has dementia    Family History  Problem Relation Age of Onset  . Arthritis Mother   . Hypertension Mother   . Alcohol abuse Mother   . CVA Mother   . Hypertension Father   . Asthma Father   . Dementia Father   . Transient ischemic attack Father 60  . Cancer Paternal Grandfather        stomach  . Hodgkin's lymphoma Sister        history of  . Heart disease Sister        arrhythmia (required cardioversion)--age 65  . Cancer Maternal Uncle         stomach  . Parkinson's disease Paternal Grandmother   . Diabetes Other  amputee    Outpatient Encounter Medications as of 03/28/2018  Medication Sig Note  . amLODipine (NORVASC) 10 MG tablet Take 1 tablet (10 mg total) by mouth daily.   Marland Kitchen aspirin EC 81 MG tablet Take 81 mg by mouth daily.   Marland Kitchen atenolol-chlorthalidone (TENORETIC) 50-25 MG tablet Take 1 tablet by mouth daily.   Marland Kitchen atorvastatin (LIPITOR) 40 MG tablet Take 1 tablet (40 mg total) by mouth daily.   . blood glucose meter kit and supplies KIT 1 each by Does not apply route daily as needed.   Marland Kitchen glucose blood test strip 1 each by Other route as needed for other. Use as instructed   . LANCETS ULTRA THIN 30G MISC 1 each by Does not apply route 2 (two) times daily. E11.9-DX test twice daily   . losartan (COZAAR) 25 MG tablet Take 1 tablet (25 mg total) by mouth daily.   . metFORMIN (GLUCOPHAGE-XR) 500 MG 24 hr tablet Take 2 daily   . Multiple Vitamins-Minerals (PRESERVISION AREDS PO) Take 2 tablets by mouth daily.   Glory Rosebush DELICA LANCETS 85U MISC 1 each by Does not apply route 2 (two) times daily.   Marland Kitchen PARoxetine (PAXIL) 40 MG tablet Take 1 tablet (40 mg total) by mouth every morning.   . potassium chloride SA (K-DUR,KLOR-CON) 20 MEQ tablet Take 2 tablets (40 mEq total) by mouth daily.   Marland Kitchen thiamine (VITAMIN B-1) 100 MG tablet Take 100 mg by mouth daily.   Marland Kitchen tobramycin-dexamethasone (TOBRADEX) ophthalmic solution INSTILL 1 DROP IN THE LEFT EYE 4 TIMES DAILY FOR 10 DAYS 03/28/2018: Uses prn when rosacea affects his eye--used today  . budesonide (RHINOCORT ALLERGY) 32 MCG/ACT nasal spray Place into both nostrils daily.   Marland Kitchen Propylene Glycol (SYSTANE BALANCE OP) Apply 2 drops to eye daily. 1 drop in each eye once a day.    No facility-administered encounter medications on file as of 03/28/2018.     Allergies  Allergen Reactions  . Betadine [Povidone Iodine] Rash    ROS: The patient denies anorexia, fever, headaches, hoarseness,  chest pain, palpitations, dizziness, syncope, dyspnea on exertion, cough, swelling, no melena, hematochezia, indigestion/heartburn, hematuria, dysuria, genital lesions, weakness, tremor, suspicious skin lesions (some rashes and dry skin related to psoriasis; no rosacea flares), abnormal bleeding/bruising, or enlarged lymph nodes. No edema. Moods are good overall. 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 Nocturia (between 1-3 hours, better when limits liquids at night), improved since changing diuretic to daytime. +ED, not interested in treatment at this time Dry skin/fissures on his hands, Eucerin helps some    PHYSICAL EXAM:  BP 114/62   Pulse (!) 48   Ht 5' 4.75" (1.645 m)   Wt 240 lb (108.9 kg)   BMI 40.25 kg/m   Wt Readings from Last 3 Encounters:  03/28/18 240 lb (108.9 kg)  12/31/17 250 lb (113.4 kg)  10/03/17 244 lb (110.7 kg)    General Appearance:   Alert, cooperative, no distress, appears stated age  Head:   Normocephalic, without obvious abnormality, atraumatic  Eyes:   PERRL, conjunctiva/corneas clear, EOM's intact, fundi benign. Minimal redness at left lateral eye  Ears:   Normal TM's and external ear canals  Nose:  Nares normal, mucosa is mildly edematous, no drainage or sinus tenderness  Throat:  Lips, mucosa, and tongue normal; teeth and gums  normal  Neck:  Supple, no lymphadenopathy; thyroid: noenlargement/tenderness/ nodules; no carotid bruit or JVD  Back:  Spine nontender, no curvature, ROM normal, no CVAtenderness  Lungs:   Clear to auscultation bilaterally without wheezes, rales or ronchi; respirations unlabored  Chest Wall:   No tenderness or deformity  Heart:   Regular rate (mild bradycardia, repeat pulse was 60 by MD) and rhythm, S1 and S2  normal, no murmur, rub or gallop  Breast Exam:   No chest wall tenderness, or masses  Abdomen:   Soft, non-tender, nondistended, active bowel sounds,   no masses, no hepatosplenomegaly. +abdominal obesity  Genitalia:   Normal male external genitalia without lesions. Testicles without masses. No inguinal hernias.  Rectal:   Normal sphincter tone. Prostate is smooth, no nodules, no significant enlargement. Heme negative light brown stool. No masses  Extremities:  No clubbing, cyanosis or edema. Dry calloused feet (heels); no lesions, normal sensation. Some thickening noted laterally on feet as well.  Pulses:  2+ and symmetric all extremities  Skin:  Skin color, texture, turgor normal; no lesions noted.Thickened, dry skin with fissures noted on hands, worst on side of right 3rd finger. Some other patches on hand and other fingers (bilat). Small mild psoriatic plaque left posterior calf.  Lymph nodes:  Cervical, supraclavicular, and axillary nodes normal  Neurologic:  CNII-XII intact, normal strength, sensation and gait; reflexes 2+ and symmetric throughout   Psych: Normal mood, affect, hygiene and grooming  Diabetic foot exam--normal   Lab Results  Component Value Date   HGBA1C 5.9 (A) 03/28/2018     ASSESSMENT/PLAN:  Annual physical exam - Plan: POCT Urinalysis DIP (Proadvantage Device)  Medicare annual wellness visit, subsequent  Diabetes mellitus without complication ([place]) - improved, continue metformin, healthier diet, wt loss. Add exerise and checking sugars - Plan: HgB A1c, Microalbumin / creatinine urine ratio  Essential hypertension, benign - controlled - Plan: amLODipine (NORVASC) 10 MG tablet, atenolol-chlorthalidone (TENORETIC) 50-25 MG tablet, losartan (COZAAR) 25 MG tablet, Basic metabolic panel  Hypercholesteremia - at goal; continue statin - Plan: atorvastatin (LIPITOR) 40 MG tablet  Generalized  anxiety disorder - doing well, continue paxil. Risks in elderly reviewed. - Plan: PARoxetine (PAXIL) 40 MG tablet  Hypokalemia - Plan: potassium chloride SA (K-DUR,KLOR-CON) 20 MEQ tablet, Basic metabolic panel  Dyshidrotic eczema - of hands; discussed proper treatment (moisturizer, vaseline/occlusion)  Psoriasis - encouraged him to f/u with dermatologist given flares of eczema, rosacea, and also dx of psoriasis  Bradycardia - contributed by meds; asymptomatic; reviewed s/sx, and to decrease beta blocker dose if develop or if persistently <50  OSA (obstructive sleep apnea) - encouraged to f/u and have new dental appliance made (with sleep study to follow-up, while using appliance)  Alcoholism in remission ([place])  Prostate cancer screening  Obesity, morbid, BMI 40.0-49.9 ([place]) - counseled re: diet, exercise, risks   A1c b-met, urine microalbumin  F/u 6 months, sooner prn.  If your pulse remains <50, especially if any lightheadedness, fatigue, shortness of breath or chest pain, we need to decrease the dose of your tenoretic (cut it in half), as the pulse being low may be causing you problems, and this is the medication that slows your pulse).   Recommended at least 30 minutes of aerobic activity at least 5 days/week, weight-bearing exercise 2x/wk; proper sunscreen use reviewed; healthy diet and alcohol recommendations (less than or equal to 2 drinks/day) reviewed; regular seatbelt use; changing batteries in smoke detectors. Self-testicular exams. Immunization recommendations discussed-- UTD, continue yearly high  dose flu shots.Shingrix recommended. Colonoscopy recommendations reviewed--due again 01/2022.  MOST form reviewed and updated. Full Code, full care. Reminded to get Living Will and Healthcare power of attorney forms notarized, and to return a copy to [place] once completed.  1 hour visit, significant counseling re: diabetes, obesity, skin conditions, OSA treatment, all separate from  health maintenance counseling.   Medicare Attestation I have personally reviewed: The patient's medical and social history Their use of alcohol, tobacco or illicit drugs Their current medications and supplements The patient's functional ability including ADLs,fall risks, home safety risks, cognitive, and hearing and visual impairment Diet and physical activities Evidence for depression or mood disorders  The patient's weight, height and BMI have been recorded in the chart.  I have made referrals, counseling, and provided education to the patient based on review of the above and I have provided the patient with a written personalized care plan for preventive services.

## 2018-03-27 NOTE — Patient Instructions (Addendum)
HEALTH MAINTENANCE RECOMMENDATIONS:  It is recommended that you get at least 30 minutes of aerobic exercise at least 5 days/week (for weight loss, you may need as much as 60-90 minutes). This can be any activity that gets your heart rate up. This can be divided in 10-15 minute intervals if needed, but try and build up your endurance at least once a week.  Weight bearing exercise is also recommended twice weekly.  Eat a healthy diet with lots of vegetables, fruits and fiber.  "Colorful" foods have a lot of vitamins (ie green vegetables, tomatoes, red peppers, etc).  Limit sweet tea, regular sodas and alcoholic beverages, all of which has a lot of calories and sugar.  Up to 2 alcoholic drinks daily may be beneficial for men (unless trying to lose weight, watch sugars).  Drink a lot of water.  Sunscreen of at least SPF 30 should be used on all sun-exposed parts of the skin when outside between the hours of 10 am and 4 pm (not just when at beach or pool, but even with exercise, golf, tennis, and yard work!)  Use a sunscreen that says "broad spectrum" so it covers both UVA and UVB rays, and make sure to reapply every 1-2 hours.  Remember to change the batteries in your smoke detectors when changing your clock times in the spring and fall.  Use your seat belt every time you are in a car, and please drive safely and not be distracted with cell phones and texting while driving.   Shannon Stone , Thank you for taking time to come for your Medicare Wellness Visit. I appreciate your ongoing commitment to your health goals. Please review the following plan we discussed and let me know if I can assist you in the future.   These are the goals we discussed: Goals   None     This is a list of the screening recommended for you and due dates:  Health Maintenance  Topic Date Due  . Flu Shot  03/14/2018  . Complete foot exam   03/30/2018  . Eye exam for diabetics  06/11/2018  . Hemoglobin A1C  06/26/2018  .  Colon Cancer Screening  01/29/2022  . Tetanus Vaccine  08/07/2023  .  Hepatitis C: One time screening is recommended by Center for Disease Control  (CDC) for  adults born from [birth year] through 1964.   Completed  . Pneumonia vaccines  Completed   High dose flu shot recommended when available (Sept/Oct)--not yet available. Foot exam was done today.  Check when you're in [place] next for information on the last shingles vaccine that you got.  If it was Zostavax, then I still recommend getting Shingrix.  If it was Shingrix, you need a second dose from a pharmacy.  I recommend getting the new shingles vaccine (Shingrix). You will need to check with your insurance to see if it is covered, and if covered by Medicare Part D, you need to get from the pharmacy rather than our office.  It is a series of 2 injections, spaced 2 months apart.  (if you got second Zostavax while in [place], I still recommend Shingrix--a more effective vaccine).  Now that your dental work is finished, please get a new appliance for your sleep apnea.  Be sure that you have had (or get) a sleep study while wearing the appliance to make sure that your sleep apnea is adequately being treated.  If your pulse remains <50, especially if any lightheadedness, fatigue, shortness  of breath or chest pain, we need to decrease the dose of your tenoretic (cut it in half), as the pulse being low may be causing you problems, and this is the medication that slows your pulse). Symptoms of slow heart rate:  What are the signs or symptoms? Symptoms of this condition include:  Light-headedness.  Feeling faint or fainting.  Fatigue and weakness.  Shortness of breath.  Chest pain (angina).  Drowsiness.  Confusion.  Dizziness.

## 2018-03-28 ENCOUNTER — Ambulatory Visit (INDEPENDENT_AMBULATORY_CARE_PROVIDER_SITE_OTHER): Payer: PPO | Admitting: Family Medicine

## 2018-03-28 ENCOUNTER — Encounter: Payer: Self-pay | Admitting: Family Medicine

## 2018-03-28 VITALS — BP 114/62 | HR 48 | Ht 64.75 in | Wt 240.0 lb

## 2018-03-28 DIAGNOSIS — L409 Psoriasis, unspecified: Secondary | ICD-10-CM

## 2018-03-28 DIAGNOSIS — F411 Generalized anxiety disorder: Secondary | ICD-10-CM | POA: Diagnosis not present

## 2018-03-28 DIAGNOSIS — L301 Dyshidrosis [pompholyx]: Secondary | ICD-10-CM | POA: Diagnosis not present

## 2018-03-28 DIAGNOSIS — Z0000 Encounter for general adult medical examination without abnormal findings: Secondary | ICD-10-CM

## 2018-03-28 DIAGNOSIS — F1021 Alcohol dependence, in remission: Secondary | ICD-10-CM | POA: Diagnosis not present

## 2018-03-28 DIAGNOSIS — E119 Type 2 diabetes mellitus without complications: Secondary | ICD-10-CM

## 2018-03-28 DIAGNOSIS — E7800 Pure hypercholesterolemia, unspecified: Secondary | ICD-10-CM | POA: Diagnosis not present

## 2018-03-28 DIAGNOSIS — R001 Bradycardia, unspecified: Secondary | ICD-10-CM | POA: Diagnosis not present

## 2018-03-28 DIAGNOSIS — E876 Hypokalemia: Secondary | ICD-10-CM | POA: Diagnosis not present

## 2018-03-28 DIAGNOSIS — Z125 Encounter for screening for malignant neoplasm of prostate: Secondary | ICD-10-CM

## 2018-03-28 DIAGNOSIS — I10 Essential (primary) hypertension: Secondary | ICD-10-CM

## 2018-03-28 DIAGNOSIS — G4733 Obstructive sleep apnea (adult) (pediatric): Secondary | ICD-10-CM

## 2018-03-28 LAB — POCT URINALYSIS DIP (PROADVANTAGE DEVICE)
Bilirubin, UA: NEGATIVE
Glucose, UA: NEGATIVE mg/dL
Ketones, POC UA: NEGATIVE mg/dL
Leukocytes, UA: NEGATIVE
NITRITE UA: NEGATIVE
PH UA: 6.5 (ref 5.0–8.0)
Protein Ur, POC: NEGATIVE mg/dL
RBC UA: NEGATIVE
Specific Gravity, Urine: 1.015

## 2018-03-28 LAB — POCT GLYCOSYLATED HEMOGLOBIN (HGB A1C): Hemoglobin A1C: 5.9 % — AB (ref 4.0–5.6)

## 2018-03-28 MED ORDER — ATENOLOL-CHLORTHALIDONE 50-25 MG PO TABS: 1.0000 | ORAL_TABLET | Freq: Every day | ORAL | 1 refills | Status: DC

## 2018-03-28 MED ORDER — ATORVASTATIN CALCIUM 40 MG PO TABS: 40.0000 mg | ORAL_TABLET | Freq: Every day | ORAL | 1 refills | Status: DC

## 2018-03-28 MED ORDER — LOSARTAN POTASSIUM 25 MG PO TABS: 25.0000 mg | ORAL_TABLET | Freq: Every day | ORAL | 1 refills | Status: DC

## 2018-03-28 MED ORDER — PAROXETINE HCL 40 MG PO TABS: 40.0000 mg | ORAL_TABLET | Freq: Every morning | ORAL | 1 refills | Status: DC

## 2018-03-28 MED ORDER — AMLODIPINE BESYLATE 10 MG PO TABS: 10.0000 mg | ORAL_TABLET | Freq: Every day | ORAL | 1 refills | Status: DC

## 2018-03-28 MED ORDER — POTASSIUM CHLORIDE CRYS ER 20 MEQ PO TBCR: 40.0000 meq | EXTENDED_RELEASE_TABLET | Freq: Every day | ORAL | 1 refills | Status: DC

## 2018-03-29 LAB — BASIC METABOLIC PANEL
BUN/Creatinine Ratio: 21 (ref 10–24)
BUN: 16 mg/dL (ref 8–27)
CHLORIDE: 99 mmol/L (ref 96–106)
CO2: 24 mmol/L (ref 20–29)
CREATININE: 0.78 mg/dL (ref 0.76–1.27)
Calcium: 9.6 mg/dL (ref 8.6–10.2)
GFR calc Af Amer: 106 mL/min/{1.73_m2} (ref >59)
GFR calc non Af Amer: 91 mL/min/{1.73_m2} (ref >59)
Glucose: 110 mg/dL — ABNORMAL HIGH (ref 65–99)
Potassium: 3.9 mmol/L (ref 3.5–5.2)
SODIUM: 143 mmol/L (ref 134–144)

## 2018-03-29 LAB — MICROALBUMIN / CREATININE URINE RATIO
CREATININE, UR: 148.1 mg/dL
MICROALBUM., U, RANDOM: 10.2 ug/mL
Microalb/Creat Ratio: 6.9 mg/g creat (ref 0.0–30.0)

## 2018-06-20 ENCOUNTER — Encounter: Payer: Self-pay | Admitting: *Deleted

## 2018-07-10 ENCOUNTER — Other Ambulatory Visit: Payer: Self-pay | Admitting: Family Medicine

## 2018-07-10 DIAGNOSIS — E119 Type 2 diabetes mellitus without complications: Secondary | ICD-10-CM

## 2018-08-21 ENCOUNTER — Other Ambulatory Visit: Payer: Self-pay | Admitting: Family Medicine

## 2018-08-21 DIAGNOSIS — I10 Essential (primary) hypertension: Secondary | ICD-10-CM

## 2018-10-01 NOTE — Progress Notes (Signed)
Chief Complaint  Patient presents with  . Diabetes    fasting med check-did have eye exam in 2019 in [facility] but cannot remember where. He said he feels like he can hear his heartbeating in left ear for a few weeks.    Diabetes:  He is compliant with metformin, tolerating 2/d without side effects. He didn't bring his list of sugars, isn't checking much at all, none since 2023/08/07. He hasn't been having cookies or cakes, occasionally has candy. Diabetic eye exam per our records 05/2017. Went to an eye doctor in [place] last year, can't recall her name.  Reports no evidence of diabetes.  Also has macular degeneration. Checks feet regularly without concerns/lesions/numbness Lab Results  Component Value Date   HGBA1C 5.9 (A) 03/28/2018   Hypertension follow-up: He continueson losartan 29m daily,in addition to amlodipine and tenoretic. He hasn't checked his BP since D2024-12-24 Denies dizziness, headaches. Denies side effects of medications. Tries to follow a low sodium diet.  infrequent lunch meats.   Exercise--occasional walk, 10 minutes 3x/week. "I hear my heartbeat in my left ear".  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  Denies any decrease in hearing in the left ear.  He has noticed this x 3-4 weeks.  Denies congestion, allergies.  Hyperlipidemia follow-up: Patient is reportedly following a low cholesterol diet. Compliant with medications and denies medication side effects. Compliant with taking atorvastatin 45mdaily.  Lab Results  Component Value Date   CHOL 94 (L) 12/24/2017   HDL 39 (L) 12/24/2017   LDLCALC 44 12/24/2017   TRIG 53 12/24/2017   CHOLHDL 2.4 12/24/2017    OSA: He hadn't been using oral appliance due to dental work--it didn't fit, and was told he would need new one made once completed.He has finished the dental work 6 months ago, hasn't gotten the new appliance made. +fatigue.  Anxiety: He continues on Paxil, which he has previously reported to  keep him calmer, no longer gets angry. Denies side effects. He recalls getting irritable when he ran out of it in the past. Feels like he has been a little in the dumps since his sister died in De24-Dec-2024but denies depression. Sister was diagnosed with lung cancer and died shortly thereafter, on 122025-01-02  [place].  Doing better now, but has been down.  PMH, PSH, SH reviewed  Outpatient Encounter Medications as of 10/02/2018  Medication Sig Note  . amLODipine (NORVASC) 10 MG tablet Take 1 tablet (10 mg total) by mouth daily.   . Marland Kitchenspirin EC 81 MG tablet Take 81 mg by mouth daily.   . Marland Kitchentenolol-chlorthalidone (TENORETIC) 50-25 MG tablet Take 1 tablet by mouth daily.   . Marland Kitchentorvastatin (LIPITOR) 40 MG tablet Take 1 tablet (40 mg total) by mouth daily.   . Marland Kitchenosartan (COZAAR) 25 MG tablet Take 1 tablet by mouth every day   . metFORMIN (GLUCOPHAGE-XR) 500 MG 24 hr tablet Take 2 tablets by mouth daily   . Multiple Vitamins-Minerals (PRESERVISION AREDS 2 PO) Take 2 capsules by mouth.   . Glory RosebushELICA LANCETS 3325OISC 1 each by Does not apply route 2 (two) times daily.   . Marland KitchenARoxetine (PAXIL) 40 MG tablet Take 1 tablet (40 mg total) by mouth every morning.   . potassium chloride SA (K-DUR,KLOR-CON) 20 MEQ tablet Take 2 tablets (40 mEq total) by mouth daily.   . Marland Kitchenropylene Glycol (SYSTANE BALANCE OP) Apply 2 drops to  eye daily. 1 drop in each eye once a day.   . thiamine (VITAMIN B-1) 100 MG tablet Take 100 mg by mouth daily.   . [DISCONTINUED] Multiple Vitamins-Minerals (PRESERVISION AREDS PO) Take 2 tablets by mouth daily.   . budesonide (RHINOCORT ALLERGY) 32 MCG/ACT nasal spray Place into both nostrils daily.   . [DISCONTINUED] blood glucose meter kit and supplies KIT 1 each by Does not apply route daily as needed.   . [DISCONTINUED] glucose blood test strip 1 each by Other route as needed for other. Use as instructed   . [DISCONTINUED] LANCETS ULTRA  THIN 30G MISC 1 each by Does not apply route 2 (two) times daily. E11.9-DX test twice daily   . [DISCONTINUED] tobramycin-dexamethasone (TOBRADEX) ophthalmic solution INSTILL 1 DROP IN THE LEFT EYE 4 TIMES DAILY FOR 10 DAYS 03/28/2018: Uses prn when rosacea affects his eye--used today   No facility-administered encounter medications on file as of 10/02/2018.    Allergies  Allergen Reactions  . Betadine [Povidone Iodine] Rash   ROS:  No fever or chills. ST a few days ago, resolved.  Allergies aren't flaring. No nausea, vomiting, bowel changes.  Some urinary urgency, no dysuria. No bleeding, bruising, rashes. No edema, chest pain, shortness of breath. He reports his feet have cleared up a lot, just slightly dry. See HPI   PHYSICAL EXAM:  BP 140/70   Pulse 60   Ht 5' 4.75" (1.645 m)   Wt 240 lb 3.2 oz (109 kg)   BMI 40.28 kg/m   122/60 on repeat by MD  Wt Readings from Last 3 Encounters:  10/02/18 240 lb 3.2 oz (109 kg)  03/28/18 240 lb (108.9 kg)  12/31/17 250 lb (113.4 kg)   Well-appearing, obese male in no distress HEENT: conjunctiva and sclera are clear, EOMI, TM's normal. OP clear Neck: no lymphadenopathy or mass Heart: regular rate and rhythm Lungs: clear Back: no spinal or CVA tenderness Abdomen: obese, soft, nontender Extremities: no edema Skin: normal turgor, no visible rashes   Lab Results  Component Value Date   HGBA1C 6.1 (A) 10/02/2018    ASSESSMENT/PLAN:  Diabetes mellitus without complication ([place]) - continue metformin. Daily exercise and weight loss encouraged - Plan: HgB A1c, Comprehensive metabolic panel, Lipid panel  Essential hypertension, benign - elevated initially, better on repeat. Exercise, weight loss, monitor at home - Plan: Comprehensive metabolic panel, amLODipine (NORVASC) 10 MG tablet, atenolol-chlorthalidone (TENORETIC) 50-25 MG tablet, losartan (COZAAR) 25 MG tablet  Hypercholesteremia - Plan: Lipid panel, atorvastatin (LIPITOR) 40 MG  tablet  Medication monitoring encounter - Plan: CBC with Differential/Platelet, Comprehensive metabolic panel, Lipid panel  Essential hypertension, benign - Plan: Comprehensive metabolic panel, amLODipine (NORVASC) 10 MG tablet, atenolol-chlorthalidone (TENORETIC) 50-25 MG tablet, losartan (COZAAR) 25 MG tablet  Hypercholesteremia - continue statin - Plan: Lipid panel, atorvastatin (LIPITOR) 40 MG tablet  Type 2 diabetes mellitus without complication, without long-term current use of insulin ([facility]) - Plan: metFORMIN (GLUCOPHAGE-XR) 500 MG 24 hr tablet  Generalized anxiety disorder - doing well, continue paxil. some depression/grief--encouraged counseling - Plan: PARoxetine (PAXIL) 40 MG tablet  Hypokalemia - Plan: potassium chloride SA (K-DUR,KLOR-CON) 20 MEQ tablet  OSA (obstructive sleep apnea) - risks of untreated OSA reviewed in detail; encouraged to get new appliance vs start CPAP  Class 3 severe obesity due to excess calories with serious comorbidity and body mass index (BMI) of 40.0 to 44.9 in adult [facility]) - counseled re: risks of obesity, diet, exercise, wt loss  Need to get copy  of his diabetic eye exam.  c-met, CBC, lipids Can hold off on TSH and PSA --can either draw at his 3 month f/u vs at his CPE  F/u 3 mos, and schedule CPE for 6 mos.   Please try and get [place] the name/number of the eye doctor you last saw so that we can get the results of your diabetic eye exam.  You need to get your new dental appliance made to treat your sleep apnea, if not we can get you set up for CPAP.  Your energy and blood pressure should improve when your sleep apnea is treated.  Consider getting grief counseling.  If you can't get "out of the dump", we may need to consider adjusting your medication  (Paxil)

## 2018-10-02 ENCOUNTER — Ambulatory Visit (INDEPENDENT_AMBULATORY_CARE_PROVIDER_SITE_OTHER): Payer: PPO | Admitting: Family Medicine

## 2018-10-02 ENCOUNTER — Encounter: Payer: Self-pay | Admitting: Family Medicine

## 2018-10-02 VITALS — BP 122/60 | HR 60 | Ht 64.75 in | Wt 240.2 lb

## 2018-10-02 DIAGNOSIS — Z6841 Body Mass Index (BMI) 40.0 and over, adult: Secondary | ICD-10-CM | POA: Diagnosis not present

## 2018-10-02 DIAGNOSIS — E7800 Pure hypercholesterolemia, unspecified: Secondary | ICD-10-CM

## 2018-10-02 DIAGNOSIS — F411 Generalized anxiety disorder: Secondary | ICD-10-CM | POA: Diagnosis not present

## 2018-10-02 DIAGNOSIS — I10 Essential (primary) hypertension: Secondary | ICD-10-CM | POA: Diagnosis not present

## 2018-10-02 DIAGNOSIS — E876 Hypokalemia: Secondary | ICD-10-CM | POA: Diagnosis not present

## 2018-10-02 DIAGNOSIS — E119 Type 2 diabetes mellitus without complications: Secondary | ICD-10-CM

## 2018-10-02 DIAGNOSIS — G4733 Obstructive sleep apnea (adult) (pediatric): Secondary | ICD-10-CM

## 2018-10-02 DIAGNOSIS — Z5181 Encounter for therapeutic drug level monitoring: Secondary | ICD-10-CM | POA: Diagnosis not present

## 2018-10-02 LAB — POCT GLYCOSYLATED HEMOGLOBIN (HGB A1C): Hemoglobin A1C: 6.1 % — AB (ref 4.0–5.6)

## 2018-10-02 MED ORDER — ATORVASTATIN CALCIUM 40 MG PO TABS: 40.0000 mg | ORAL_TABLET | Freq: Every day | ORAL | 1 refills | Status: DC

## 2018-10-02 MED ORDER — AMLODIPINE BESYLATE 10 MG PO TABS: 10.0000 mg | ORAL_TABLET | Freq: Every day | ORAL | 1 refills | Status: DC

## 2018-10-02 MED ORDER — LOSARTAN POTASSIUM 25 MG PO TABS: 25.0000 mg | ORAL_TABLET | Freq: Every day | ORAL | 1 refills | Status: DC

## 2018-10-02 MED ORDER — METFORMIN HCL ER 500 MG PO TB24: ORAL_TABLET | ORAL | 1 refills | Status: DC

## 2018-10-02 MED ORDER — PAROXETINE HCL 40 MG PO TABS: 40.0000 mg | ORAL_TABLET | Freq: Every morning | ORAL | 1 refills | Status: DC

## 2018-10-02 MED ORDER — ATENOLOL-CHLORTHALIDONE 50-25 MG PO TABS: 1.0000 | ORAL_TABLET | Freq: Every day | ORAL | 1 refills | Status: DC

## 2018-10-02 MED ORDER — POTASSIUM CHLORIDE CRYS ER 20 MEQ PO TBCR: 40.0000 meq | EXTENDED_RELEASE_TABLET | Freq: Every day | ORAL | 1 refills | Status: DC

## 2018-10-02 NOTE — Patient Instructions (Signed)
  Please try and get [place] the name/number of the eye doctor you last saw so that we can get the results of your diabetic eye exam.  You need to get your new dental appliance made to treat your sleep apnea, if not we can get you set up for CPAP.  Your energy and blood pressure should improve when your sleep apnea is treated.  Consider getting grief counseling.  If you can't get "out of the dump", we may need to consider adjusting your medication  (Paxil)  Restart monitoring your blood sugars and your blood pressure. Bring these lists to your next visit.  Return sooner than 3 months if they are high.   It is recommended that you get at least 30 minutes of aerobic exercise at least 5 days/week (for weight loss, you may need as much as 60-90 minutes). This can be any activity that gets your heart rate up. This can be divided in 10-15 minute intervals if needed, but try and build up your endurance at least once a week.  Weight bearing exercise is also recommended twice weekly.

## 2018-10-03 LAB — CBC WITH DIFFERENTIAL/PLATELET
Basophils Absolute: 0 10*3/uL (ref 0.0–0.2)
Basos: 1 %
EOS (ABSOLUTE): 0.3 10*3/uL (ref 0.0–0.4)
Eos: 4 %
HEMOGLOBIN: 16.3 g/dL (ref 13.0–17.7)
Hematocrit: 45.4 % (ref 37.5–51.0)
IMMATURE GRANS (ABS): 0 10*3/uL (ref 0.0–0.1)
Immature Granulocytes: 0 %
LYMPHS: 30 %
Lymphocytes Absolute: 2.4 10*3/uL (ref 0.7–3.1)
MCH: 31.9 pg (ref 26.6–33.0)
MCHC: 35.9 g/dL — ABNORMAL HIGH (ref 31.5–35.7)
MCV: 89 fL (ref 79–97)
MONOCYTES: 8 %
Monocytes Absolute: 0.7 10*3/uL (ref 0.1–0.9)
NEUTROS ABS: 4.6 10*3/uL (ref 1.4–7.0)
Neutrophils: 57 %
Platelets: 178 10*3/uL (ref 150–450)
RBC: 5.11 x10E6/uL (ref 4.14–5.80)
RDW: 12.9 % (ref 11.6–15.4)
WBC: 8.1 10*3/uL (ref 3.4–10.8)

## 2018-10-03 LAB — COMPREHENSIVE METABOLIC PANEL
A/G RATIO: 2.1 (ref 1.2–2.2)
ALBUMIN: 4.5 g/dL (ref 3.7–4.7)
ALT: 26 IU/L (ref 0–44)
AST: 17 IU/L (ref 0–40)
Alkaline Phosphatase: 65 IU/L (ref 39–117)
BILIRUBIN TOTAL: 0.7 mg/dL (ref 0.0–1.2)
BUN/Creatinine Ratio: 21 (ref 10–24)
BUN: 18 mg/dL (ref 8–27)
CHLORIDE: 100 mmol/L (ref 96–106)
CO2: 27 mmol/L (ref 20–29)
Calcium: 9.9 mg/dL (ref 8.6–10.2)
Creatinine, Ser: 0.84 mg/dL (ref 0.76–1.27)
GFR calc non Af Amer: 88 mL/min/{1.73_m2} (ref >59)
GFR, EST AFRICAN AMERICAN: 102 mL/min/{1.73_m2} (ref >59)
Globulin, Total: 2.1 g/dL (ref 1.5–4.5)
Glucose: 148 mg/dL — ABNORMAL HIGH (ref 65–99)
POTASSIUM: 4.1 mmol/L (ref 3.5–5.2)
Sodium: 142 mmol/L (ref 134–144)
TOTAL PROTEIN: 6.6 g/dL (ref 6.0–8.5)

## 2018-10-03 LAB — LIPID PANEL
Chol/HDL Ratio: 2.3 ratio (ref 0.0–5.0)
Cholesterol, Total: 123 mg/dL (ref 100–199)
HDL: 53 mg/dL (ref >39)
LDL Calculated: 52 mg/dL (ref 0–99)
Triglycerides: 92 mg/dL (ref 0–149)
VLDL CHOLESTEROL CAL: 18 mg/dL (ref 5–40)

## 2019-01-01 ENCOUNTER — Encounter: Payer: PPO | Admitting: Family Medicine

## 2019-02-10 LAB — HM DIABETES EYE EXAM

## 2019-04-16 NOTE — Progress Notes (Signed)
Chief Complaint  Patient presents with  . Medicare Wellness    fasting AWV/CPE. No new concerns. Patient is moving to [facility] Sept 23, 2020.     Shannon Stone is a 73 y.o. male who presents for annual wellness visit and follow-up on chronic medical conditions.  He has the following concerns:  Son is moving to [place], and daughter lives there, so he sold his house and will be moving to the [place] in 2 weeks.  He is looking forward to the move.  Diabetes: He is compliant with metformin, tolerating 2/d without side effects. He forgot to bring his list of sugars, but recalls they were "good last week", reviewed by the guy from [facility] who came.  Reports he did a fingerstick test, weighed him also. BP was also good (120/70).  He hasn't been having cookies or cakes.  Admits to not eating breakfast or lunch, just supper. Ate cookies just once, given to him by the realtor after selling his house. Diabetic eye exam was done 01/2019, no retinopathy.  Checks feet regularly without concerns/lesions/numbness Lab Results  Component Value Date   HGBA1C 6.1 (A) 10/02/2018   Hypertension follow-up: He continueson losartan 54m daily,in addition to amlodipine and tenoretic. BP's have been running 110/60 up to 150/70, mostly 130/70's. Denies dizziness (only if he turns around too quickly), headaches. Denies side effects of medications. Tries to follow a low sodium diet. infrequent lunch meats.    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  Denies any decrease in hearing in the left ear. He has some congestion from allergies, had slight sore throat this morning, already resolved.  Hyperlipidemia follow-up: Patient is reportedly following a low cholesterol diet. Compliant with medications and denies medication side effects. He cut back on cheese. Compliant with taking atorvastatin 444mdaily.  Lab Results  Component Value Date   CHOL 123 10/02/2018   HDL 53 10/02/2018   LDLCALC 52  10/02/2018   TRIG 92 10/02/2018   CHOLHDL 2.3 10/02/2018    OSA: He hadn't been using oral appliance due to dental work--it didn't fit, and was told he would need new one made once completed.He has finished the dental work a year ago. He still never got the appliance. Since he had to put his cat down, he has fewer disturbances at night (cat would wake him up a lot), so feels a little more refreshed when he wakes up.  He does report dozing off easily if sitting still for a long time, never while driving. Doesn't report significant fatigue.  Anxiety: He continues on Paxil, which he has previously reported to keep him calmer, no longer gets angry. Denies side effects. He recalls getting irritable when he ran out of it in the past. We have received notification from [facility] that paxil is high risk, recommended change to sertraline, duloxetine or citalopram (starting at lower doses). Previously not changed, because stable on med for so long.  Discussed today, and willing to change.    Allergies: Controlled with flonase (no longer taking rhinocort, OTC), been taking for about a month.  He has also been using zyrtec prn. Some flare of allergies recently, with morning sore throats.  Psoriasis:He no longer sees dermatologist.  Uses steroid cream prn to affected areas (legs).small area on his right calf, but elbows and hands and feet have been pretty good. His feet are still dry, but [place].  Neck and back pain: Previously under  the care of Dr. Alfonso Ramus at [facility] [place](prior to that hadinjections from Dr. Brien Few). He completed a course of PT in the past, doesn't do home exercises.  In the [facility] caused radiculopathy to flare. "same as it always has been"--tingling down right arm into the right index finger and thumb.  Only rarely hurts, mostly numbness/tingling. Denies weakness. Occasionally has some pain only at night.  Wakes up sometimes  with pain down the left leg occasionally, relieved by position change. Occurs about once or twice a week.  Denies back pain.   Immunization History  Administered Date(s) Administered  . Influenza Split 05/02/2012, 04/24/2013, 05/06/2015  . Influenza, High Dose Seasonal PF 05/07/2014, 05/31/2017, 06/13/2018  . Influenza-Unspecified 04/27/2016  . Pneumococcal Conjugate-13 05/07/2014  . Pneumococcal Polysaccharide-23 04/24/2013  . Td 08/15/2003  . Tdap 08/06/2013  . Zoster 11/12/2014   He reports he had another shingles vaccine (believes Zostavax) while in [place] last year (he was trying to get the shingrix that was recommended for him to get from pharmacy here)--he will try and verify which shot exactly he got when he returns. He was back in March, but didn't go to the doctor to check.  Last colonoscopy:01/2017: tubular adenoma and hyperplastic polyp. F/U in 5 years (Dr. Starr Sinclair) Last PSA: 12/2017 PSA 3.6 Recent Labs       Lab Results  Component Value Date   PSA 2.3 12/06/2016   PSA 3.32 11/17/2015   PSA 2.83 04/30/2014     Dentist: twice yearly Ophtho:yearly  Exercise:"I just walk around", empty cart in [place] Negative hepatitis C screen 11/2015  Other doctors caring for patient include: Ophtho:Dr. Harlin Rain Urology: Dr. Serita Butcher (not seen recently, but in past) Dermatologist: [facility] (in the past, none recent) GI:Dr. Amedeo Plenty ENT: Dr. Erik Obey Podiatrist: Dr. Paulla Dolly at [facility] (in the past) Dentist: Dr. Quincy Simmonds Ortho: Dr. 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 Has urgency. Right tinnitus and hearing loss,  unchanged. Mini-Cog screen normal (5)   End of Life Discussion: Patient has a living will and medical power of attorney.   Past Medical History:  Diagnosis Date  . Alcohol dependence ([place])    history of abuse/dependence-s/p inpatient rehab at [facility] 08/2008  . Anxiety   . Arthritis    OA-knees, elbow  . Brachial neuritis or radiculitis NOS 03/03/2014  . Complication of anesthesia    DIFFICULTY URINATING  . Depression    and anxiety  . Diabetes mellitus without complication ([place])    history of borderline DM/elevated fasting glucose.  . Diverticulosis    sigmoid(5/08)  . Dry eye   . Elevated LFTs    (ETOH)fatty infiltration on u/s  . Esophageal reflux   . Heart palpitations 09/06/2016  . 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 Hypertension   . Impotence of organic origin    erectile dysfunction--has seen Dr. Serita Butcher  . Macular degeneration   . Microscopic hematuria    eval by Dr. Serita Butcher (pt reports h/o kidney stones)  . Neck pain    cervical radiculopathy-Dr. Sherley Bounds; persistent, s/p surgery  . Nocturia   . Other and unspecified hyperlipidemia  h/o elevated cholesterol  . Periodic limb movements of sleep    per sleep study 11/02  . Plantar fasciitis    treated by Dr. Paulla Dolly (injection)  . Psoriasis   . Rosacea   . 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 Sleep apnea    (done at [facility]); central and obstructive; using device from dentist  . Spinal stenosis in cervical region 03/03/14  . 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 Urgency of urination     Past Surgical History:  Procedure Laterality Date  . CERVICAL SPINE SURGERY  2011, 2013   Dr. Sherley Bounds  . KNEE SURGERY Left 2013  . POSTERIOR CERVICAL FUSION/FORAMINOTOMY  12/01/2011   Procedure: POSTERIOR CERVICAL FUSION/FORAMINOTOMY LEVEL 1;  Surgeon: Eustace Moore, MD;  Location: [facility];  Service: Neurosurgery;  Laterality:  Bilateral;  Cervical five-six posterior cervical fusion   . UMBILICAL HERNIA REPAIR     AS CHILD     Social History   Socioeconomic History  . Marital status: Single    Spouse name: Not on file  . Number of children: 3  . Years of education: Not on file  . Highest education level: Not on file  Occupational History  . Occupation: retired Midwife)  Social Needs  . Financial resource strain: Not on file  . Food insecurity    Worry: Not on file    Inability: Not on file  . Transportation needs    Medical: Not on file    Non-medical: Not on file  Tobacco Use  . Smoking status: Former Smoker    Packs/day: 1.00    Years: 18.00    Pack years: 18.00    Types: Cigarettes    Quit date: 08/14/1990    Years since quitting: 28.6  . Smokeless tobacco: Never Used  Substance and Sexual Activity  . Alcohol use: No    Comment: zero drinks in 5 years.  sober since 08/26/2008  . Drug use: No  . Sexual activity: Not Currently  Lifestyle  . Physical activity    Days per week: Not on file    Minutes per session: Not on file  . Stress: Not on file  Relationships  . Social Herbalist on phone: Not on file    Gets together: Not on file    Attends religious service: Not on file    Active member of club or organization: Not on file    Attends meetings of clubs or organizations: Not on file    Relationship status: Not on file  . Intimate partner violence    Fear of current or ex partner: Not on file    Emotionally abused: Not on file    Physically abused: Not on file    Forced sexual activity: Not on file  Other Topics Concern  . Not on file  Social History Narrative   Divorced.  Lives alone, 1 cat.  Retired Conservation officer, nature.  Alcoholic--sober since 02/8294.  Active in AA, daily meetings, and he volunteers at [employer] (where he did rehab), detox at [facility] and at the jail.  2 daughters and a son (1 in [place], 1 in [place], [place], 1 in [place], [place]). 5 grandchildren.    Dad is in [place], has dementia    Family History  Problem Relation Age of Onset  . Arthritis Mother   . Hypertension Mother   . Alcohol abuse  Mother   . CVA Mother   . Hypertension Father   . Asthma Father   . Dementia Father   . Transient ischemic attack Father 61  . Cancer Paternal Grandfather        stomach  . Hodgkin's lymphoma Sister        history of  . Heart disease Sister        arrhythmia (required cardioversion)--age 7  . Cancer Sister 16       lung (adenocarcinoma); h/o tobacco use  . Cancer Maternal Uncle        stomach  . Parkinson's disease Paternal Grandmother   . Diabetes Other        amputee    Outpatient Encounter Medications as of 04/17/2019  Medication Sig  . amLODipine (NORVASC) 10 MG tablet Take 1 tablet (10 mg total) by mouth daily.  Marland Kitchen aspirin EC 81 MG tablet Take 81 mg by mouth daily.  Marland Kitchen atenolol-chlorthalidone (TENORETIC) 50-25 MG tablet Take 1 tablet by mouth daily.  Marland Kitchen atorvastatin (LIPITOR) 40 MG tablet Take 1 tablet (40 mg total) by mouth daily.  . budesonide (RHINOCORT ALLERGY) 32 MCG/ACT nasal spray Place into both nostrils daily.  Marland Kitchen losartan (COZAAR) 25 MG tablet Take 1 tablet (25 mg total) by mouth daily.  . metFORMIN (GLUCOPHAGE-XR) 500 MG 24 hr tablet Take 2 tablets by mouth daily  . Multiple Vitamins-Minerals (PRESERVISION AREDS 2 PO) Take 2 capsules by mouth.  Glory Rosebush DELICA LANCETS 68L MISC 1 each by Does not apply route 2 (two) times daily.  Marland Kitchen PARoxetine (PAXIL) 40 MG tablet Take 1 tablet (40 mg total) by mouth every morning.  . potassium chloride SA (K-DUR) 20 MEQ tablet Take 2 tablets (40 mEq total) by mouth daily.  Marland Kitchen Propylene Glycol (SYSTANE BALANCE OP) Apply 2 drops to eye daily. 1 drop in each eye once a day.  . thiamine (VITAMIN B-1) 100 MG tablet Take 100 mg by mouth daily.  . [DISCONTINUED] amLODipine (NORVASC) 10 MG tablet Take 1 tablet (10 mg total) by mouth daily.  . [DISCONTINUED] atenolol-chlorthalidone (TENORETIC)  50-25 MG tablet Take 1 tablet by mouth daily.  . [DISCONTINUED] atorvastatin (LIPITOR) 40 MG tablet Take 1 tablet (40 mg total) by mouth daily.  . [DISCONTINUED] losartan (COZAAR) 25 MG tablet Take 1 tablet (25 mg total) by mouth daily.  . [DISCONTINUED] metFORMIN (GLUCOPHAGE-XR) 500 MG 24 hr tablet Take 2 tablets by mouth daily  . [DISCONTINUED] PARoxetine (PAXIL) 40 MG tablet Take 1 tablet (40 mg total) by mouth every morning.  . [DISCONTINUED] potassium chloride SA (K-DUR,KLOR-CON) 20 MEQ tablet Take 2 tablets (40 mEq total) by mouth daily.  . citalopram (CELEXA) 20 MG tablet Take 1 tablet by mouth daily (TO REPLACE PAROXETINE). Can increase to 2 pills after 1-2 weeks if anxiety isn't controlled on 1 tablet   No facility-administered encounter medications on file as of 04/17/2019.    Taking flonase, not rhinocort, for allergies (OTC)  Allergies  Allergen Reactions  . Betadine [Povidone Iodine] Rash    ROS: The patient denies anorexia, fever, headaches, hoarseness, chest pain, palpitations, dizziness, syncope, dyspnea on exertion, cough, swelling, no melena, hematochezia, indigestion/heartburn, hematuria, dysuria, genital lesions, weakness, tremor, suspicious skin lesions (just psoriasis, not flaring badly; no rosacea flares), abnormal bleeding/bruising, or enlarged lymph nodes. No edema. Moods are good overall. 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  Ears:   Normal TM's and external ear  canals  Nose:  Not examined, wearing mask due to COVID-19 pandemic  Throat:  Not examined, wearing mask due to COVID-19 pandemic  Neck:  Supple, no lymphadenopathy; thyroid:noenlargement/ tenderness/ nodules; no carotid bruit or JVD. WHSS posteriorly  Back:  Spine nontender, no curvature, ROM normal, no CVAtenderness  Lungs:   Clear to auscultation bilaterally without wheezes, rales or ronchi; respirations unlabored  Chest Wall:   No tenderness or deformity  Heart:   Regular rate and rhythm, S1 and S2 normal, no murmur, rub or gallop  Breast Exam:   No chest wall tenderness, or masses  Abdomen:   Soft, non-tender, nondistended, active bowel sounds,   no masses, no hepatosplenomegaly. +abdominal obesity  Genitalia:   Normal male external genitalia without lesions. Testicles without masses. No inguinal hernias.  Rectal:  Normal sphincter tone. Prostate is smooth, no nodules, no significant enlargement. Heme negative light brown stool. No masses  Extremities:  No clubbing, cyanosis or edema. Dry/thickened heels, mild; no lesions, normal sensation.   Pulses:  2+ and symmetric all extremities  Skin:  Skin color, texture, turgor normal; no lesions noted.  Small mild psoriatic plaque right posterior calf. Elbows, knees and gluteal cleft are all clear  Lymph nodes:  Cervical, supraclavicular, and axillary nodes normal  Neurologic:  CNII-XII intact, normal strength, sensation and gait; reflexes 2+ and symmetric throughout   Psych: Normal mood, affect, hygiene and grooming  Diabetic foot exam--normal  Lab Results  Component Value Date   HGBA1C 6.6 (A) 04/17/2019    ASSESSMENT/PLAN:  Annual physical exam - Plan: Microalbumin / creatinine urine ratio, TSH, Comprehensive metabolic panel, PSA  Medicare annual wellness visit, subsequent  Alcoholism in remission [facility])  Prostate  cancer screening - Plan: PSA  Psoriasis - pretty well controlled currently  OSA (obstructive sleep apnea) - since noncompliant in getting oral appliance, encouraged him to try and get CPAP when he moves (may need repeat study?)  Essential hypertension, benign - controlled on current regimen. Encouraged daily exercise, weight loss, low na diet - Plan: amLODipine (NORVASC) 10 MG tablet, atenolol-chlorthalidone (TENORETIC) 50-25 MG tablet, losartan (COZAAR) 25 MG tablet  Medication monitoring encounter - Plan: Microalbumin / creatinine urine ratio, Comprehensive metabolic panel  Hypertension associated with diabetes ([place])  Type 2 diabetes with complication ([place]) - controlled with metformin. Encouraged daily exercise, weight loss, reviewed proper diet - Plan: HgB A1c, Microalbumin / creatinine urine ratio, TSH, Comprehensive metabolic panel  Hyperlipidemia associated with type 2 diabetes mellitus ([place])  Need for influenza vaccination - Plan: Flu Vaccine QUAD High Dose(Fluad)  Hypercholesteremia - continue statin, at goal on last check - Plan: atorvastatin (LIPITOR) 40 MG tablet  Type 2 diabetes mellitus without complication, without long-term current use of insulin ([place]) - Plan: metFORMIN (GLUCOPHAGE-XR) 500 MG 24 hr tablet  Generalized anxiety disorder - change from Paxil to citalopram after settled in after move (lower risk medication); may need to titrate citalopram dose up to 39m - Plan: PARoxetine (PAXIL) 40 MG tablet  Hypokalemia - Plan: potassium chloride SA (K-DUR) 20 MEQ tablet   Urine microalbumin, PSA, TSH, c-met Fasted for over 6 hours   Counseled re: PSA screening in detail; recommended at least 30 minutes of aerobic activity at least 5 days/week, weight-bearing exercise 2x/wk; proper sunscreen use reviewed; healthy diet and alcohol recommendations (less than or equal to 2 drinks/day) reviewed; regular seatbelt use; changing batteries in smoke detectors.  Immunization  recommendations discussed-- UTD, continue yearly high dose flu shots, given today.Shingrix recommended, to get from pharmacy (and will try and check what he got when in [place])..Colonoscopy recommendations reviewed--due again 01/2022.  MOST form reviewed and updated. Full Code, full care.  No f/u needed--patient is moving in 2 weeks. Advised we will continue to care for him until he gets new doctor (should by 6 months).  To contact us within the next few weeks regarding the citalopram--he will need refills, and need to know if he is taking 33m or if doubled up and needs to change to 427mtablet.    It has been suggested to change your paroxetine (due to it being higher risk in people over 65).  I don't want to make changes right before your move, with lots of transition.  I'm sending in a 30 day prescription for the paxil. One you finish this, you can THEN start the citalopram 2067mdon't take both of them, take the citalopram AFTER you finish the paroxetine).  The 19m96m a little lower dose than what you are currently taking.  Start at this dose for 1-2 weeks; if you find that you are more irritable, down or anxious, you can increase to 20 pills (for total of 40mg67me), and contact [place] next [place].  If ultimately you do not tolerate this or it doesn't work, we can always change back.   Medicare Attestation I have personally reviewed: The patient's medical and social history Their use of alcohol, tobacco or illicit drugs Their current medications and supplements The patient's functional ability including ADLs,fall risks, home safety risks, cognitive, and hearing and visual impairment Diet and physical activities Evidence for depression or mood disorders  The patient's weight, height, BMI, and visual acuity have been recorded in the chart.  I have made referrals, counseling, and provided education to the patient based on review of the above and I have provided the  patient with a written personalized care plan for preventive services.

## 2019-04-16 NOTE — Patient Instructions (Addendum)
HEALTH MAINTENANCE RECOMMENDATIONS:  It is recommended that you get at least 30 minutes of aerobic exercise at least 5 days/week (for weight loss, you may need as much as 60-90 minutes). This can be any activity that gets your heart rate up. This can be divided in 10-15 minute intervals if needed, but try and build up your endurance at least once a week.  Weight bearing exercise is also recommended twice weekly.  Eat a healthy diet with lots of vegetables, fruits and fiber.  "Colorful" foods have a lot of vitamins (ie green vegetables, tomatoes, red peppers, etc).  Limit sweet tea, regular sodas and alcoholic beverages, all of which has a lot of calories and sugar.  Drink a lot of water.  Sunscreen of at least SPF 30 should be used on all sun-exposed parts of the skin when outside between the hours of 10 am and 4 pm (not just when at beach or pool, but even with exercise, golf, tennis, and yard work!)  Use a sunscreen that says "broad spectrum" so it covers both UVA and UVB rays, and make sure to reapply every 1-2 hours.  Remember to change the batteries in your smoke detectors when changing your clock times in the spring and fall. Carbon monoxide detectors are recommended for your home.  Use your seat belt every time you are in a car, and please drive safely and not be distracted with cell phones and texting while driving.   Shannon Stone , Thank you for taking time to come for your Medicare Wellness Visit. I appreciate your ongoing commitment to your health goals. Please review the following plan we discussed and let me know if I can assist you in the future.   This is a list of the screening recommended for you and due dates:  Health Maintenance  Topic Date Due  . Flu Shot  03/15/2019  . Complete foot exam   03/29/2019  . Hemoglobin A1C  04/02/2019  . Eye exam for diabetics  02/10/2020  . Colon Cancer Screening  01/29/2022  . Tetanus Vaccine  08/07/2023  .  Hepatitis C: One time screening  is recommended by Center for Disease Control  (CDC) for  adults born from [birth year] through 1964.   Completed  . Pneumonia vaccines  Completed   Hemoglobin A1c, foot exam and flu shot were done today.  I recommend getting the new shingles vaccine (Shingrix). You will need to get this from the pharmacy as it is covered by Medicare Part D.  It is a series of 2 injections, spaced 2 months apart.  Not sure what kind of vaccine you got when you were in [place]. If it was NOT the Shingrix, then you should start and finish the whole series. If it WAS Shingrix, you might still gain some benefit from getting a second shot, even though it is out of the recommended range.   It is important to get your sleep apnea treated.  Since you are moving, you will have to do this in [place].  I recommend trying CPAP.  We can send your sleep study results, but depending on your insurance, if it has been too long, you may need to have it repeated out there.    It has been suggested to change your paroxetine (due to it being higher risk in people over 65).  I don't want to make changes right before your move, with lots of transition.  I'm sending in a 30 day prescription for the paxil. One  you finish this, you can THEN start the citalopram 20mg  (don't take both of them, take the citalopram AFTER you finish the paroxetine).  The 20mg  is a little lower dose than what you are currently taking.  Start at this dose for 1-2 weeks; if you find that you are more irritable, down or anxious, you can increase to 20 pills (for total of 40mg  dose), and contact us to change the next prescription.  If ultimately you do not tolerate this or it doesn't work, we can always change back.

## 2019-04-17 ENCOUNTER — Encounter: Payer: Self-pay | Admitting: Family Medicine

## 2019-04-17 ENCOUNTER — Ambulatory Visit (INDEPENDENT_AMBULATORY_CARE_PROVIDER_SITE_OTHER): Payer: PPO | Admitting: Family Medicine

## 2019-04-17 ENCOUNTER — Other Ambulatory Visit: Payer: Self-pay

## 2019-04-17 VITALS — BP 122/68 | HR 56 | Temp 97.3°F | Ht 65.0 in | Wt 240.8 lb

## 2019-04-17 DIAGNOSIS — L409 Psoriasis, unspecified: Secondary | ICD-10-CM | POA: Diagnosis not present

## 2019-04-17 DIAGNOSIS — I10 Essential (primary) hypertension: Secondary | ICD-10-CM | POA: Diagnosis not present

## 2019-04-17 DIAGNOSIS — I152 Hypertension secondary to endocrine disorders: Secondary | ICD-10-CM

## 2019-04-17 DIAGNOSIS — Z0000 Encounter for general adult medical examination without abnormal findings: Secondary | ICD-10-CM | POA: Diagnosis not present

## 2019-04-17 DIAGNOSIS — F1021 Alcohol dependence, in remission: Secondary | ICD-10-CM | POA: Diagnosis not present

## 2019-04-17 DIAGNOSIS — G4733 Obstructive sleep apnea (adult) (pediatric): Secondary | ICD-10-CM | POA: Diagnosis not present

## 2019-04-17 DIAGNOSIS — E118 Type 2 diabetes mellitus with unspecified complications: Secondary | ICD-10-CM

## 2019-04-17 DIAGNOSIS — Z125 Encounter for screening for malignant neoplasm of prostate: Secondary | ICD-10-CM

## 2019-04-17 DIAGNOSIS — Z23 Encounter for immunization: Secondary | ICD-10-CM | POA: Diagnosis not present

## 2019-04-17 DIAGNOSIS — E1159 Type 2 diabetes mellitus with other circulatory complications: Secondary | ICD-10-CM

## 2019-04-17 DIAGNOSIS — E119 Type 2 diabetes mellitus without complications: Secondary | ICD-10-CM

## 2019-04-17 DIAGNOSIS — E7800 Pure hypercholesterolemia, unspecified: Secondary | ICD-10-CM | POA: Diagnosis not present

## 2019-04-17 DIAGNOSIS — E1169 Type 2 diabetes mellitus with other specified complication: Secondary | ICD-10-CM

## 2019-04-17 DIAGNOSIS — E876 Hypokalemia: Secondary | ICD-10-CM

## 2019-04-17 DIAGNOSIS — Z5181 Encounter for therapeutic drug level monitoring: Secondary | ICD-10-CM | POA: Diagnosis not present

## 2019-04-17 DIAGNOSIS — F411 Generalized anxiety disorder: Secondary | ICD-10-CM

## 2019-04-17 DIAGNOSIS — E785 Hyperlipidemia, unspecified: Secondary | ICD-10-CM

## 2019-04-17 LAB — POCT GLYCOSYLATED HEMOGLOBIN (HGB A1C): Hemoglobin A1C: 6.6 % — AB (ref 4.0–5.6)

## 2019-04-17 MED ORDER — ATENOLOL-CHLORTHALIDONE 50-25 MG PO TABS: 1.0000 | ORAL_TABLET | Freq: Every day | ORAL | 1 refills | Status: AC

## 2019-04-17 MED ORDER — METFORMIN HCL ER 500 MG PO TB24: ORAL_TABLET | ORAL | 1 refills | Status: AC

## 2019-04-17 MED ORDER — AMLODIPINE BESYLATE 10 MG PO TABS: 10.0000 mg | ORAL_TABLET | Freq: Every day | ORAL | 1 refills | Status: AC

## 2019-04-17 MED ORDER — CITALOPRAM HYDROBROMIDE 20 MG PO TABS: ORAL_TABLET | ORAL | 0 refills | Status: DC

## 2019-04-17 MED ORDER — ATORVASTATIN CALCIUM 40 MG PO TABS: 40.0000 mg | ORAL_TABLET | Freq: Every day | ORAL | 1 refills | Status: AC

## 2019-04-17 MED ORDER — LOSARTAN POTASSIUM 25 MG PO TABS: 25.0000 mg | ORAL_TABLET | Freq: Every day | ORAL | 1 refills | Status: AC

## 2019-04-17 MED ORDER — PAROXETINE HCL 40 MG PO TABS: 40.0000 mg | ORAL_TABLET | Freq: Every morning | ORAL | 0 refills | Status: AC

## 2019-04-17 MED ORDER — POTASSIUM CHLORIDE CRYS ER 20 MEQ PO TBCR: 40.0000 meq | EXTENDED_RELEASE_TABLET | Freq: Every day | ORAL | 1 refills | Status: AC

## 2019-04-18 ENCOUNTER — Telehealth: Payer: Self-pay | Admitting: Family Medicine

## 2019-04-18 LAB — COMPREHENSIVE METABOLIC PANEL
ALT: 29 IU/L (ref 0–44)
AST: 16 IU/L (ref 0–40)
Albumin/Globulin Ratio: 2 (ref 1.2–2.2)
Albumin: 4.6 g/dL (ref 3.7–4.7)
Alkaline Phosphatase: 80 IU/L (ref 39–117)
BUN/Creatinine Ratio: 22 (ref 10–24)
BUN: 17 mg/dL (ref 8–27)
Bilirubin Total: 0.6 mg/dL (ref 0.0–1.2)
CO2: 24 mmol/L (ref 20–29)
Calcium: 9.4 mg/dL (ref 8.6–10.2)
Chloride: 102 mmol/L (ref 96–106)
Creatinine, Ser: 0.76 mg/dL (ref 0.76–1.27)
GFR calc Af Amer: 106 mL/min/{1.73_m2} (ref >59)
GFR calc non Af Amer: 92 mL/min/{1.73_m2} (ref >59)
Globulin, Total: 2.3 g/dL (ref 1.5–4.5)
Glucose: 144 mg/dL — ABNORMAL HIGH (ref 65–99)
Potassium: 3.6 mmol/L (ref 3.5–5.2)
Sodium: 140 mmol/L (ref 134–144)
Total Protein: 6.9 g/dL (ref 6.0–8.5)

## 2019-04-18 LAB — MICROALBUMIN / CREATININE URINE RATIO
Creatinine, Urine: 161.9 mg/dL
Microalb/Creat Ratio: 13 mg/g creat (ref 0–29)
Microalbumin, Urine: 21.4 ug/mL

## 2019-04-18 LAB — PSA: Prostate Specific Ag, Serum: 3.3 ng/mL (ref 0.0–4.0)

## 2019-04-18 LAB — TSH: TSH: 2.13 u[IU]/mL (ref 0.450–4.500)

## 2019-04-18 NOTE — Telephone Encounter (Signed)
[facility] called about quantity on citalopram Rx Because of the possible dosing change she needs to verify quantity  They need to send to the patient   [facility] direct line [phone]

## 2019-04-18 NOTE — Telephone Encounter (Signed)
It is per THEIR recommendation to change from paxil to citalopram, and to start with lower doses.  So, directions for citalopram are 20mg  daily, BUT, if he finds his anxiety is much worse, then he can increase to 2 pills.  I didn't put refills on it in case I need to change his dose to 40mg  for the next refill.  I wrote for #90, with no refills (as patient will touch base with [place] in the next month, to let us know how he's doing), which I feel is appropriate.   Please advise them of this

## 2019-04-22 NOTE — Telephone Encounter (Signed)
Left detailed message on [facility] machine about the directions for citaopram

## 2019-06-06 DIAGNOSIS — M47816 Spondylosis without myelopathy or radiculopathy, lumbar region: Secondary | ICD-10-CM | POA: Diagnosis not present

## 2019-06-06 DIAGNOSIS — M62830 Muscle spasm of back: Secondary | ICD-10-CM | POA: Diagnosis not present

## 2019-06-06 DIAGNOSIS — M5136 Other intervertebral disc degeneration, lumbar region: Secondary | ICD-10-CM | POA: Diagnosis not present

## 2019-06-06 DIAGNOSIS — M545 Low back pain: Secondary | ICD-10-CM | POA: Diagnosis not present

## 2019-06-06 DIAGNOSIS — I700 Atherosclerosis of aorta: Secondary | ICD-10-CM | POA: Diagnosis not present

## 2019-06-06 DIAGNOSIS — M549 Dorsalgia, unspecified: Secondary | ICD-10-CM | POA: Diagnosis not present

## 2019-06-07 DIAGNOSIS — L03019 Cellulitis of unspecified finger: Secondary | ICD-10-CM | POA: Diagnosis not present

## 2019-06-08 DIAGNOSIS — L03019 Cellulitis of unspecified finger: Secondary | ICD-10-CM | POA: Diagnosis not present

## 2019-06-08 DIAGNOSIS — M19041 Primary osteoarthritis, right hand: Secondary | ICD-10-CM | POA: Diagnosis not present

## 2019-06-10 DIAGNOSIS — L03011 Cellulitis of right finger: Secondary | ICD-10-CM | POA: Diagnosis not present

## 2019-06-10 DIAGNOSIS — L02511 Cutaneous abscess of right hand: Secondary | ICD-10-CM | POA: Diagnosis not present

## 2019-06-10 DIAGNOSIS — L03019 Cellulitis of unspecified finger: Secondary | ICD-10-CM | POA: Diagnosis not present

## 2019-06-24 ENCOUNTER — Other Ambulatory Visit: Payer: Self-pay | Admitting: Family Medicine

## 2019-06-24 NOTE — Telephone Encounter (Signed)
Refill sent, but if it is too early, they may not fill it.  Also, let him know that this is an inexpensive medication to pay cash for usually (at [employer], [facility], etc), so not to worry too much

## 2019-06-24 NOTE — Telephone Encounter (Signed)
I don't think he should need this yet.  It was sent for 90d on 9/3, but he wasn't going to start it right away.  He should still have over a month (if not two) left.  Not sure if he has found a new doctor yet (he moved to [place]), and if he has, he should get refills there.  (I see rx for keflex and ibuprofen in system from a pharmacy in [place], but don't know if that rx was from his new primary, vs urgent care, vs dentist).  Regardless, refills at the earliest would be needed in December.

## 2019-06-24 NOTE — Telephone Encounter (Signed)
Spoke to patient and he is living in [place] now. He has not found a doctor there yet as they are working on getting [employer] there. He stated he asked for the refill early because he don't think he will have insurance by the time his Celexa runs out in December.

## 2019-08-29 ENCOUNTER — Telehealth: Payer: Self-pay

## 2019-08-29 NOTE — Telephone Encounter (Signed)
He has moved to [place]. He may have a new doctor there, not sure. If you have a number for him, you can double check.  If you don't, deny refill, needs to come from new provider.  If patient states he doesn't have an appointment with new doctor yet, then we can refill x 90d

## 2019-08-29 NOTE — Telephone Encounter (Signed)
Pt. Stated he has moved to [place] but has not been able to get in with new doctor there yet, he doesn't need a refill yet but probably will on another month or so, he may have to come back her to you to get another refill. He also mentioned that he will not be using [facility] will be a different mail order not sure which one yet.

## 2019-08-29 NOTE — Telephone Encounter (Signed)
Received fax from [facility] order pharmacy stating the pt. Needs a refill on his Citalopram 20mg  last apt. Was 04/17/19.
